# Patient Record
Sex: Male | Born: 1985 | Race: Black or African American | Hispanic: No | Marital: Single | Smoking: Current every day smoker
Health system: Southern US, Community
[De-identification: ages and names within clinical notes are randomized; demographics above are authoritative.]

## PROBLEM LIST (undated history)

## (undated) DIAGNOSIS — Z8709 Personal history of other diseases of the respiratory system: Secondary | ICD-10-CM

## (undated) DIAGNOSIS — Z794 Long term (current) use of insulin: Secondary | ICD-10-CM

## (undated) DIAGNOSIS — J45909 Unspecified asthma, uncomplicated: Secondary | ICD-10-CM

## (undated) DIAGNOSIS — I10 Essential (primary) hypertension: Secondary | ICD-10-CM

## (undated) DIAGNOSIS — N471 Phimosis: Secondary | ICD-10-CM

## (undated) DIAGNOSIS — E119 Type 2 diabetes mellitus without complications: Secondary | ICD-10-CM

## (undated) HISTORY — PX: NO PAST SURGERIES: SHX2092

## (undated) HISTORY — DX: Essential (primary) hypertension: I10

---

## 2013-10-25 DIAGNOSIS — Z560 Unemployment, unspecified: Secondary | ICD-10-CM | POA: Insufficient documentation

## 2016-08-04 DIAGNOSIS — B353 Tinea pedis: Secondary | ICD-10-CM | POA: Insufficient documentation

## 2016-08-04 DIAGNOSIS — F17200 Nicotine dependence, unspecified, uncomplicated: Secondary | ICD-10-CM | POA: Insufficient documentation

## 2016-08-11 ENCOUNTER — Emergency Department (HOSPITAL_COMMUNITY)
Admission: EM | Admit: 2016-08-11 | Discharge: 2016-08-11 | Disposition: A | Discharge status: Home / Self Care | Payer: Medicaid Other | Attending: Emergency Medicine | Admitting: Emergency Medicine

## 2016-08-11 ENCOUNTER — Encounter (HOSPITAL_COMMUNITY): Payer: Self-pay | Admitting: Emergency Medicine

## 2016-08-11 DIAGNOSIS — N476 Balanoposthitis: Secondary | ICD-10-CM | POA: Insufficient documentation

## 2016-08-11 DIAGNOSIS — F17210 Nicotine dependence, cigarettes, uncomplicated: Secondary | ICD-10-CM | POA: Diagnosis not present

## 2016-08-11 DIAGNOSIS — R1909 Other intra-abdominal and pelvic swelling, mass and lump: Secondary | ICD-10-CM | POA: Diagnosis present

## 2016-08-11 DIAGNOSIS — J45909 Unspecified asthma, uncomplicated: Secondary | ICD-10-CM | POA: Diagnosis not present

## 2016-08-11 DIAGNOSIS — E119 Type 2 diabetes mellitus without complications: Secondary | ICD-10-CM | POA: Insufficient documentation

## 2016-08-11 HISTORY — DX: Unspecified asthma, uncomplicated: J45.909

## 2016-08-11 HISTORY — DX: Type 2 diabetes mellitus without complications: E11.9

## 2016-08-11 LAB — URINALYSIS, ROUTINE W REFLEX MICROSCOPIC
BACTERIA UA: NONE SEEN
Bilirubin Urine: NEGATIVE
Glucose, UA: >=500 mg/dL — AB
Ketones, ur: 5 mg/dL — AB
Leukocytes, UA: NEGATIVE
Nitrite: NEGATIVE
Protein, ur: 30 mg/dL — AB
SPECIFIC GRAVITY, URINE: 1.033 — AB (ref 1.005–1.030)
pH: 5 (ref 5.0–8.0)

## 2016-08-11 LAB — RAPID HIV SCREEN (HIV 1/2 AB+AG)
HIV 1/2 Antibodies: NONREACTIVE
HIV-1 P24 ANTIGEN - HIV24: NONREACTIVE

## 2016-08-11 MED ORDER — CEFTRIAXONE SODIUM 250 MG IJ SOLR
250.0000 mg | Freq: Once | INTRAMUSCULAR | Status: AC
  Administered 2016-08-11: 250 mg via INTRAMUSCULAR
  Filled 2016-08-11: qty 250

## 2016-08-11 MED ORDER — CLOTRIMAZOLE 1 % EX CREA: TOPICAL_CREAM | CUTANEOUS | 0 refills | Status: DC

## 2016-08-11 MED ORDER — CEPHALEXIN 500 MG PO CAPS: 500.0000 mg | ORAL_CAPSULE | Freq: Four times a day (QID) | ORAL | 0 refills | Status: DC

## 2016-08-11 MED ORDER — AZITHROMYCIN 250 MG PO TABS
1000.0000 mg | ORAL_TABLET | Freq: Once | ORAL | Status: AC
  Administered 2016-08-11: 1000 mg via ORAL
  Filled 2016-08-11: qty 4

## 2016-08-11 MED ORDER — LIDOCAINE HCL (PF) 1 % IJ SOLN
INTRAMUSCULAR | Status: AC
  Administered 2016-08-11: 5 mL
  Filled 2016-08-11: qty 5

## 2016-08-11 NOTE — Discharge Instructions (Signed)
Clean the tip of the penis with soap and water.  Apply the cream as directed.  Take antibiotics as directed.  Return to the ER if you have redness of you scrotum, or redness/pain that spreads between you legs or back toward your buttock.

## 2016-08-11 NOTE — ED Triage Notes (Signed)
Pt reports groin swelling and penile pain for the last month. Reports lesions around penis. Denies recent fever. Denies dysuria.

## 2016-08-11 NOTE — ED Provider Notes (Signed)
[facility]-EMERGENCY DEPT Provider Note   CSN: [id number] Arrival date & time: 08/11/16  1942   By signing my name below, I, Talbert Nan, attest that this documentation has been prepared under the direction and in the presence of Roxy Horseman, PA-C. Electronically Signed: Talbert Nan, Scribe. 08/11/16. 9:24 PM.    History   Chief Complaint Chief Complaint  Patient presents with  . Groin Swelling    HPI Albert [name] is a 31 y.o. male with h/o Dm, asthma who presents to the Emergency Department complaining of gradual onset, moderate, 6/10 severity penile pain that began 1 month ago. Pain is located on the tip of his penis.  He reports associated lesions (warts).  Denies any redness or or swelling of his scrotum.  Pt has no h/o HIV.     The history is provided by the patient. No language interpreter was used.    Past Medical History:  Diagnosis Date  . Asthma   . Diabetes mellitus without complication (HCC)     There are no active problems to display for this patient.   No past surgical history on file.     Home Medications    Prior to Admission medications   Not on File    Family History No family history on file.  Social History Social History  Substance Use Topics  . Smoking status: Current Every Day Smoker    Packs/day: 1.00    Types: Cigarettes  . Smokeless tobacco: Not on file  . Alcohol use Yes     Comment: every few days     Allergies   Patient has no known allergies.   Review of Systems Review of Systems  Genitourinary: Positive for genital sores, penile pain and penile swelling.       Chaffing  Skin:       Swelling.     Physical Exam Updated Vital Signs BP 127/79 (BP Location: Right Arm)   Pulse 108   Temp 98.2 F (36.8 C) (Oral)   Resp 20   Ht 5\' 6"  (1.676 m)   Wt 205 lb (93 kg)   SpO2 98%   BMI 33.09 kg/m   Physical Exam  Constitutional: He is oriented to person, place, and time. He appears well-developed and well-nourished.   HENT:  Head: Normocephalic and atraumatic.  Eyes: Conjunctivae and EOM are normal.  Neck: Normal range of motion.  Cardiovascular: Normal rate.   Pulmonary/Chest: Effort normal.  Abdominal: He exhibits no distension.  Genitourinary:  Genitourinary Comments: Moderate swelling of the glands and surrounding skin, there is some exudate, there is no evidence of scrotal involvement, no evidence of Fournier's gangrene, appears consistent with balanoposthitis.  Circumcised  Musculoskeletal: Normal range of motion.  Neurological: He is alert and oriented to person, place, and time.  Skin: Skin is dry.  Psychiatric: He has a normal mood and affect. His behavior is normal. Judgment and thought content normal.  Nursing note and vitals reviewed.    ED Treatments / Results   DIAGNOSTIC STUDIES: Oxygen Saturation is 98% on room air, normal by my interpretation.    COORDINATION OF CARE: 9:22 PM Discussed treatment plan with pt at bedside and pt agreed to plan, which includes Ua, blood work.    Labs (all labs ordered are listed, but only abnormal results are displayed) Labs Reviewed - No data to display  EKG  EKG Interpretation None       Radiology No results found.  Procedures Procedures (including critical care time)  Medications  Ordered in ED Medications - No data to display   Initial Impression / Assessment and Plan / ED Course  I have reviewed the triage vital signs and the nursing notes.  Pertinent labs & imaging results that were available during my care of the patient were reviewed by me and considered in my medical decision making (see chart for details).     Patient with swelling and discharge to the tip of his penis 1 month. Symptoms are consistent with balanoposthitis, discussed patient with Dr. Clarene DukeLittle, who agrees with treatment plan for clotrimazole cream, as I am suspicious of streptococcal infection, I will cover with Keflex as well. Gonorrhea and chlamydia  testing is pending. Patient is circumcised. He is able to urinate. He is afebrile. I do not see any involvement of the tissue on his scrotum or between his legs, I did not see any evidence of foreign use gangrene. Plan for follow-up with primary care provider. Additionally he has some penile warts, and he asks about having these removed, I will refer him to urology.  Final Clinical Impressions(s) / ED Diagnoses   Final diagnoses:  Balanoposthitis    New Prescriptions New Prescriptions   CEPHALEXIN (KEFLEX) 500 MG CAPSULE    Take 1 capsule (500 mg total) by mouth 4 (four) times daily.   CLOTRIMAZOLE (LOTRIMIN) 1 % CREAM    Apply to affected area 2 times daily   I personally performed the services described in this documentation, which was scribed in my presence. The recorded information has been reviewed and is accurate.      Roxy Horsemanobert Dre Gamino, PA-C 08/11/16 2215    Roxy Horsemanobert Zainah Steven, PA-C 08/11/16 2215    Laurence Spatesachel Morgan Little, MD 08/12/16 [id number]

## 2016-08-11 NOTE — ED Notes (Signed)
Phlebotomy at bedside at this time.

## 2016-08-12 LAB — RPR: RPR: NONREACTIVE

## 2016-08-14 ENCOUNTER — Encounter: Payer: Self-pay | Admitting: Podiatry

## 2016-08-14 ENCOUNTER — Ambulatory Visit (INDEPENDENT_AMBULATORY_CARE_PROVIDER_SITE_OTHER): Payer: Medicaid Other | Admitting: Podiatry

## 2016-08-14 VITALS — BP 129/93 | HR 84

## 2016-08-14 DIAGNOSIS — E119 Type 2 diabetes mellitus without complications: Secondary | ICD-10-CM

## 2016-08-14 DIAGNOSIS — B351 Tinea unguium: Secondary | ICD-10-CM

## 2016-08-14 DIAGNOSIS — M2040 Other hammer toe(s) (acquired), unspecified foot: Secondary | ICD-10-CM | POA: Diagnosis not present

## 2016-08-14 DIAGNOSIS — M79676 Pain in unspecified toe(s): Secondary | ICD-10-CM

## 2016-08-14 DIAGNOSIS — M2010 Hallux valgus (acquired), unspecified foot: Secondary | ICD-10-CM

## 2016-08-14 NOTE — Progress Notes (Signed)
   Subjective:    Patient ID: Albert MainlandLashawn Dingledine, male    DOB: 1985-10-23, 31 y.o.   MRN: [id number]  HPI this patient presents the office for painful long thick nails. She was told by his medical doctor to make an appointment for a foot exam. Patient is a type I diabetic says his nails have grown thick and long nails are painful walking and wearing his shoes. Patient also has developed painful calluses on both big toes states he has provided no self treatment nor sought any professional help for his feet. He presents the office today for an evaluation and treatment of his diabetic feet.    Review of Systems  All other systems reviewed and are negative.      Objective:   Physical Exam GENERAL APPEARANCE: Alert, conversant. Appropriately groomed. No acute distress.  VASCULAR: Pedal pulses are  palpable at  [facility] and [facility] bilateral.  Capillary refill time is immediate to all digits,  Normal temperature gradient.  Digital hair growth is present bilateral  NEUROLOGIC: sensation is normal to 5.07 monofilament at 5/5 sites bilateral.  Light touch is intact bilateral, Muscle strength normal.  MUSCULOSKELETAL: acceptable muscle strength, tone and stability bilateral.  Intrinsic muscluature intact bilateral.  HAV  B/L with hammer toes 2-4  B/L.   DERMATOLOGIC: skin color, texture, and turgor are within normal limits.  No preulcerative lesions or ulcers  are seen, no interdigital maceration noted.  No open lesions present.  . No drainage noted. Pinch callus noted both feet.  NAILS  Thick disfigured discolored nails both feet.         Assessment & Plan:  Onychomycosis  B/L  Diabetes with no complications   IE  Debride nails  Diabetic foot exam performed.  RTC 1 year for foot exam.   Helane GuntherGregory Mayer DPM

## 2016-08-18 DIAGNOSIS — E1121 Type 2 diabetes mellitus with diabetic nephropathy: Secondary | ICD-10-CM | POA: Insufficient documentation

## 2016-08-25 DIAGNOSIS — E785 Hyperlipidemia, unspecified: Secondary | ICD-10-CM | POA: Insufficient documentation

## 2016-08-28 ENCOUNTER — Encounter: Payer: Medicaid Other | Attending: Internal Medicine | Admitting: *Deleted

## 2016-08-28 DIAGNOSIS — E109 Type 1 diabetes mellitus without complications: Secondary | ICD-10-CM | POA: Insufficient documentation

## 2016-08-28 DIAGNOSIS — Z713 Dietary counseling and surveillance: Secondary | ICD-10-CM | POA: Insufficient documentation

## 2016-08-28 DIAGNOSIS — E1065 Type 1 diabetes mellitus with hyperglycemia: Secondary | ICD-10-CM

## 2016-08-28 DIAGNOSIS — E108 Type 1 diabetes mellitus with unspecified complications: Secondary | ICD-10-CM

## 2016-08-28 DIAGNOSIS — F209 Schizophrenia, unspecified: Secondary | ICD-10-CM | POA: Diagnosis not present

## 2016-08-28 DIAGNOSIS — E669 Obesity, unspecified: Secondary | ICD-10-CM | POA: Diagnosis not present

## 2016-08-28 DIAGNOSIS — Z6829 Body mass index (BMI) 29.0-29.9, adult: Secondary | ICD-10-CM | POA: Diagnosis not present

## 2016-08-28 DIAGNOSIS — IMO0002 Reserved for concepts with insufficient information to code with codable children: Secondary | ICD-10-CM

## 2016-08-28 NOTE — Patient Instructions (Signed)
Plan:  Aim for 3 Carb Choices per meal (45 grams) +/- 1 either way  Aim for 0-2 Carbs per snack if hungry  Include protein in moderation with your meals and snacks Continue with your activity level by walking daily as tolerated Consider checking BG at alternate times per day   Consider taking medication 70/30 insulin before breakfast and before supper so it matches your food intake during the day and improves your blood sugar during the night.

## 2016-09-04 NOTE — Progress Notes (Signed)
Diabetes Self-Management Education  Visit Type: First/Initial  Appt. Start Time: 0800 Appt. End Time: 0930  09/04/2016  Mr. Albert MainlandLashawn [name], identified by name and date of birth, is a 31 y.o. male with a diagnosis of Diabetes:  . He is here with his father who sat quietly through the appointment with occasional nodding to questions asked of patient. Patient expressed fair understanding of what Diabetes is and interest in learning how to manage it better. He stated he could not afford Lantus insulin so he is now taking Novolin 70/30 but not always twice a day.He did not express understanding of the action time of this insulin. He states he walks often throughout the day so his activity level appears appropriate.   ASSESSMENT  Height 5\' 8"  (1.727 m), weight 190 lb 12.8 oz (86.5 kg). Body mass index is 29.01 kg/m.      Diabetes Self-Management Education - 08/28/16 0817      Visit Information   Visit Type First/Initial     Complications   Last HgB A1C per patient/outside source 12.6 %   How often do you check your blood sugar? 1-2 times/day   Fasting Blood glucose range (mg/dL) >161>200   Postprandial Blood glucose range (mg/dL) >096>200   Number of hypoglycemic episodes per month 0     Dietary Intake   Breakfast skips 2- 4 days a week OR eggs, bacon, toast and grits   Snack (morning) not lately   Lunch sandwich OR salad with boiled meat   Snack (afternoon) not lately   Dinner baked meat, vegetables, and starch, occasionally bread   Snack (evening) occasionally some baked chips   Beverage(s) water, diet soda     Exercise   Exercise Type Light (walking / raking leaves)   How many days per week to you exercise? 7   How many minutes per day do you exercise? 20   Total minutes per week of exercise 140     Patient Education   Previous Diabetes Education Yes (please comment)      Individualized Plan for Diabetes Self-Management Training:   Learning Objective:  Patient will have a  greater understanding of diabetes self-management. Patient education plan is to attend individual and/or group sessions per assessed needs and concerns.   Plan:   Patient Instructions  Plan:  Aim for 3 Carb Choices per meal (45 grams) +/- 1 either way  Aim for 0-2 Carbs per snack if hungry  Include protein in moderation with your meals and snacks Continue with your activity level by walking daily as tolerated Consider checking BG at alternate times per day   Consider taking medication 70/30 insulin before breakfast and before supper so it matches your food intake during the day and improves your blood sugar during the night.  Expected Outcomes:     Education material provided: Insulin action handout with BG Target ranges and other drawings on back.  If problems or questions, patient to contact team via:  Phone and Email  Future DSME appointment:   1 month

## 2016-10-01 ENCOUNTER — Ambulatory Visit: Payer: Medicaid Other | Admitting: *Deleted

## 2016-10-02 ENCOUNTER — Encounter: Payer: Medicaid Other | Attending: Internal Medicine | Admitting: *Deleted

## 2016-10-02 DIAGNOSIS — E109 Type 1 diabetes mellitus without complications: Secondary | ICD-10-CM | POA: Insufficient documentation

## 2016-10-02 DIAGNOSIS — F209 Schizophrenia, unspecified: Secondary | ICD-10-CM | POA: Diagnosis not present

## 2016-10-02 DIAGNOSIS — E669 Obesity, unspecified: Secondary | ICD-10-CM | POA: Insufficient documentation

## 2016-10-02 DIAGNOSIS — Z713 Dietary counseling and surveillance: Secondary | ICD-10-CM | POA: Diagnosis not present

## 2016-10-02 DIAGNOSIS — Z6829 Body mass index (BMI) 29.0-29.9, adult: Secondary | ICD-10-CM | POA: Diagnosis not present

## 2016-10-02 DIAGNOSIS — E108 Type 1 diabetes mellitus with unspecified complications: Secondary | ICD-10-CM

## 2016-10-02 NOTE — Patient Instructions (Signed)
Consider taking your insulin every day If your blood sugar is too high,(over 300)  make sure you have taken your insulin, drink lots of water and maybe go for a walk or get some exercise Try to eat some bread or other starchy food throughout the day

## 2016-10-06 NOTE — Progress Notes (Signed)
Diabetes Self-Management Education  Visit Type:  Follow-up  Appt. Start Time: 1100 Appt. End Time: 1130  10/06/2016  Albert [name], identified by name and date of birth, is a 31 y.o. male with a diagnosis of Diabetes: Type 1.  His father did not come into my office for the visit this time, he waited in the waiting room. Patient states he has some food insecurity with running out of food and living in a boarding house without refrigeration being available. Weight gain of 4 pounds noted from last visit on 08/28/2016. He states he is taking both types of insulin, Lantus and Novolin 70/30 but not at consistent times daily.   ASSESSMENT  Height  (1.727 m), weight 195 lb (88.5 kg). Body mass index is 29.65 kg/m.       Diabetes Self-Management Education - 10/06/16 1359      Psychosocial Assessment   Self-management support Family  although his father did not come into appointment with son this time.   Special Needs Simplified materials   Preferred Learning Style Auditory;Visual;Hands on     Complications   How often do you check your blood sugar? 3-4 times / week     Dietary Intake   Breakfast --  Significant for food insecurity     Exercise   Exercise Type Light (walking / raking leaves)   How many days per week to you exercise? 7   How many minutes per day do you exercise? 20   Total minutes per week of exercise 140     Patient Education   Previous Diabetes Education Yes (please comment)   Nutrition management  Carbohydrate counting;Meal options for control of blood glucose level and chronic complications.;Other (comment)  consider eating half of meal now and saving other half for next meal   Medications Reviewed patients medication for diabetes, action, purpose, timing of dose and side effects.   Psychosocial adjustment Helped patient identify a support system for diabetes management     Individualized Goals (developed by patient)   Nutrition General guidelines for  healthy choices and portions discussed   Physical Activity Exercise 5-7 days per week   Medications take my medication as prescribed     Post-Education Assessment   Patient understands incorporating nutritional management into lifestyle. Needs Review   Patient undertands incorporating physical activity into lifestyle. Demonstrates understanding / competency   Patient understands using medications safely. Needs Review   Patient understands monitoring blood glucose, interpreting and using results Needs Review   Patient understands prevention, detection, and treatment of acute complications. Demonstrates understanding / competency     Outcomes   Program Status Not Completed      Learning Objective:  Patient will have a greater understanding of diabetes self-management. Patient education plan is to attend individual and/or group sessions per assessed needs and concerns.  Plan:   Patient Instructions  Consider taking your insulin every day If your blood sugar is too high,(over 300)  make sure you have taken your insulin, drink lots of water and maybe go for a walk or get some exercise Try to eat some bread or other starchy food throughout the day  Expected Outcomes:  Demonstrated some interest in learning.  Expect minimal changes  Education material provided: Meal plan card  If problems or questions, patient to contact team via:  Phone  Future DSME appointment: - Patient states he feels these visits are helpful and requests a follow up in 4-6 wks

## 2016-11-06 ENCOUNTER — Encounter: Payer: Medicaid Other | Attending: Internal Medicine | Admitting: *Deleted

## 2016-11-06 DIAGNOSIS — F209 Schizophrenia, unspecified: Secondary | ICD-10-CM | POA: Insufficient documentation

## 2016-11-06 DIAGNOSIS — Z6829 Body mass index (BMI) 29.0-29.9, adult: Secondary | ICD-10-CM | POA: Insufficient documentation

## 2016-11-06 DIAGNOSIS — E669 Obesity, unspecified: Secondary | ICD-10-CM | POA: Insufficient documentation

## 2016-11-06 DIAGNOSIS — Z713 Dietary counseling and surveillance: Secondary | ICD-10-CM | POA: Insufficient documentation

## 2016-11-06 DIAGNOSIS — E109 Type 1 diabetes mellitus without complications: Secondary | ICD-10-CM | POA: Diagnosis present

## 2016-11-06 DIAGNOSIS — IMO0002 Reserved for concepts with insufficient information to code with codable children: Secondary | ICD-10-CM

## 2016-11-06 DIAGNOSIS — E1065 Type 1 diabetes mellitus with hyperglycemia: Secondary | ICD-10-CM

## 2016-11-06 DIAGNOSIS — E108 Type 1 diabetes mellitus with unspecified complications: Secondary | ICD-10-CM

## 2016-11-06 NOTE — Progress Notes (Signed)
Diabetes Self-Management Education  Visit Type:  Follow-up  Appt. Start Time: 1045 Appt. End Time: 1115  11/06/2016  [name], identified by name and date of birth, is a 31 y.o. male with a diagnosis of Diabetes: Type 1.  His father came into my office for this appointment at my request. Patient complaining of diarrhea, perhaps from Metformin. He states he is taking his insulin more consistently now, and states his BG have decreased to 150-190 mg/dl range now. He states no hypoglycemia issues. Food is very limited, he states his food stamps allow for $39/month. He is currently living with his father until he can get affordable housing, which they are applying for.   ASSESSMENT  Height 5\' 8"  (1.727 m), weight 195 lb 14.4 oz (88.9 kg). Body mass index is 29.79 kg/m.       Diabetes Self-Management Education - 11/06/16 1100      Psychosocial Assessment   Special Needs Simplified materials     Complications   Fasting Blood glucose range (mg/dL) 161-096180-200   Number of hypoglycemic episodes per month 0     Dietary Intake   Breakfast --  ramen noodles   Lunch ramen noodles or burger   Dinner ramen noodles   Beverage(s) diet soda, water     Exercise   Exercise Type Light (walking / raking leaves)   How many days per week to you exercise? 7     Post-Education Assessment   Patient undertands incorporating physical activity into lifestyle. Demonstrates understanding / competency   Patient understands using medications safely. Demonstrates understanding / competency  verbalizes better understanding of his insulin action     Outcomes   Program Status Not Completed     Learning Objective:  Patient will have a greater understanding of diabetes self-management. Patient education plan is to attend individual and/or group sessions per assessed needs and concerns.  Plan:   Patient Instructions  Continue taking your insulin every day If your blood sugar is too high,(over 300)   make sure you have taken your insulin, drink lots of water and maybe go for a walk or get some exercise Try to eat some bread or other starchy food throughout the day  Expected Outcomes:  Demonstrated interest in learning. Expect positive outcomes  Education material provided: My Plate, Insulin Action handout  If problems or questions, patient to contact team via:  Phone  Future DSME appointment: - 4-6 wks

## 2016-11-06 NOTE — Patient Instructions (Signed)
Continue taking your insulin every day If your blood sugar is too high,(over 300)  make sure you have taken your insulin, drink lots of water and maybe go for a walk or get some exercise Try to eat some bread or other starchy food throughout the day

## 2016-12-09 ENCOUNTER — Encounter: Payer: Medicaid Other | Attending: Internal Medicine | Admitting: *Deleted

## 2016-12-09 DIAGNOSIS — E669 Obesity, unspecified: Secondary | ICD-10-CM | POA: Diagnosis not present

## 2016-12-09 DIAGNOSIS — Z713 Dietary counseling and surveillance: Secondary | ICD-10-CM | POA: Insufficient documentation

## 2016-12-09 DIAGNOSIS — Z6829 Body mass index (BMI) 29.0-29.9, adult: Secondary | ICD-10-CM | POA: Insufficient documentation

## 2016-12-09 DIAGNOSIS — F209 Schizophrenia, unspecified: Secondary | ICD-10-CM | POA: Diagnosis not present

## 2016-12-09 DIAGNOSIS — E109 Type 1 diabetes mellitus without complications: Secondary | ICD-10-CM | POA: Insufficient documentation

## 2016-12-09 NOTE — Progress Notes (Signed)
Diabetes Self-Management Education  Visit Type:     Appt. Start Time: 1400 Appt. End Time: 1430  12/09/2016  [name], identified by name and date of birth, is a 31 y.o. male with a diagnosis of Diabetes:  .  His father came into my office for this appointment at my request. Weight stable at 196 pounds. States still living with his father, although patient is not answering questions appropriately, discussing being on a journey, needing to get things back the way they were in the past, and mumbling. His father asked to speak with me privately and states his son is not taking his bipolar medications, is violent at times and he needs to speak with his MD for medical attention.  ASSESSMENT  Height 5\' 8"  (1.727 m), weight 196 lb (88.9 kg). Body mass index is 29.8 kg/m.   Patient repeatedly stated that he is taking his insulin every day. Most of his comments were rambling in nature but not aggressive. I reminded him to include some starch throughout the day.  Learning Objective:  Patient will have a greater understanding of diabetes self-management. Patient education plan is to attend individual and/or group sessions per assessed needs and concerns.  Plan:   Patient Instructions  Continue taking your insulin every day If your blood sugar is too high,(over 300)  make sure you have taken your insulin, drink lots of water and maybe go for a walk or get some exercise Try to eat some bread or other starchy food throughout the day  Father to reach out to patient's primary care MD for any medical information he might need regarding his son.   Expected Outcomes:     Education material provided: no new handouts today.  If problems or questions, patient to contact team via:  Phone  Future DSME appointment: -  PRN

## 2017-01-09 ENCOUNTER — Ambulatory Visit: Payer: Medicaid Other | Admitting: *Deleted

## 2017-01-13 ENCOUNTER — Encounter: Payer: Medicaid Other | Attending: Internal Medicine | Admitting: *Deleted

## 2017-01-13 DIAGNOSIS — E109 Type 1 diabetes mellitus without complications: Secondary | ICD-10-CM | POA: Diagnosis not present

## 2017-01-13 DIAGNOSIS — E669 Obesity, unspecified: Secondary | ICD-10-CM | POA: Insufficient documentation

## 2017-01-13 DIAGNOSIS — F209 Schizophrenia, unspecified: Secondary | ICD-10-CM | POA: Diagnosis not present

## 2017-01-13 DIAGNOSIS — Z713 Dietary counseling and surveillance: Secondary | ICD-10-CM | POA: Diagnosis not present

## 2017-01-13 DIAGNOSIS — E108 Type 1 diabetes mellitus with unspecified complications: Secondary | ICD-10-CM

## 2017-01-13 DIAGNOSIS — Z6829 Body mass index (BMI) 29.0-29.9, adult: Secondary | ICD-10-CM | POA: Diagnosis not present

## 2017-01-13 NOTE — Progress Notes (Signed)
Diabetes Self-Management Education  Visit Type:     Appt. Start Time: 1445 Appt. End Time: 1510  01/13/2017  [name], identified by name and date of birth, is a 31 y.o. male with a diagnosis of Type 1 Diabetes.  His father brought him today but did not come into my office for this appointment. Weight stable at 196 pounds. States still living with his father, patient answered questions more appropriately today than last visit. He states he is eating 2-3 meals a day, checks his BG every other day with BG range of 180 - 300 mg/dl. He states when asked if BG is above 300 what should he do, he answered to go for a walk. His father states  that he has not been able to speak with his son's MD yet.  ASSESSMENT  Height 5\' 8"  (1.727 m), weight 196 lb (88.9 kg). Body mass index is 29.8 kg/m.   Per diet history stated today, patient recalls mix of starch and protein at most meals. He states he is more likely to take his insulin on his "good" days when he is able to walk and get exercise during the day too.   Learning Objective:  Patient will have a greater understanding of diabetes self-management. Patient education plan is to attend individual and/or group sessions per assessed needs and concerns.  Plan:   Patient Instructions  Take your insulin every day. Your body cannot make it so it is your job to take it every day or the food won't get where it needs to go and you will get sick. If your blood sugar is too high,(over 300)  make sure you have taken your insulin, drink lots of water and then maybe go for a walk or get some exercise Try to eat some bread or other starchy food throughout the day  Expected Outcomes:     Education material provided: no new handouts today.  If problems or questions, patient to contact team via:  Phone  Future DSME appointment: -   3 months

## 2017-04-14 ENCOUNTER — Ambulatory Visit: Payer: Medicaid Other | Admitting: *Deleted

## 2017-04-17 ENCOUNTER — Encounter (HOSPITAL_COMMUNITY): Payer: Self-pay

## 2017-04-17 ENCOUNTER — Emergency Department (HOSPITAL_COMMUNITY): Payer: No Typology Code available for payment source

## 2017-04-17 ENCOUNTER — Emergency Department (HOSPITAL_COMMUNITY)
Admission: EM | Admit: 2017-04-17 | Discharge: 2017-04-17 | Disposition: A | Discharge status: Home / Self Care | Payer: No Typology Code available for payment source | Attending: Emergency Medicine | Admitting: Emergency Medicine

## 2017-04-17 DIAGNOSIS — Y9389 Activity, other specified: Secondary | ICD-10-CM | POA: Diagnosis not present

## 2017-04-17 DIAGNOSIS — S81811A Laceration without foreign body, right lower leg, initial encounter: Secondary | ICD-10-CM | POA: Diagnosis not present

## 2017-04-17 DIAGNOSIS — Y998 Other external cause status: Secondary | ICD-10-CM | POA: Diagnosis not present

## 2017-04-17 DIAGNOSIS — F17210 Nicotine dependence, cigarettes, uncomplicated: Secondary | ICD-10-CM | POA: Insufficient documentation

## 2017-04-17 DIAGNOSIS — Y929 Unspecified place or not applicable: Secondary | ICD-10-CM | POA: Insufficient documentation

## 2017-04-17 DIAGNOSIS — E119 Type 2 diabetes mellitus without complications: Secondary | ICD-10-CM | POA: Insufficient documentation

## 2017-04-17 DIAGNOSIS — Z79899 Other long term (current) drug therapy: Secondary | ICD-10-CM | POA: Insufficient documentation

## 2017-04-17 DIAGNOSIS — Z23 Encounter for immunization: Secondary | ICD-10-CM | POA: Insufficient documentation

## 2017-04-17 DIAGNOSIS — F17228 Nicotine dependence, chewing tobacco, with other nicotine-induced disorders: Secondary | ICD-10-CM | POA: Diagnosis not present

## 2017-04-17 DIAGNOSIS — J45909 Unspecified asthma, uncomplicated: Secondary | ICD-10-CM | POA: Insufficient documentation

## 2017-04-17 DIAGNOSIS — Z794 Long term (current) use of insulin: Secondary | ICD-10-CM | POA: Insufficient documentation

## 2017-04-17 DIAGNOSIS — S8991XA Unspecified injury of right lower leg, initial encounter: Secondary | ICD-10-CM | POA: Diagnosis present

## 2017-04-17 LAB — BASIC METABOLIC PANEL
ANION GAP: 13 (ref 5–15)
BUN: 14 mg/dL (ref 6–20)
CHLORIDE: 98 mmol/L — AB (ref 101–111)
CO2: 22 mmol/L (ref 22–32)
Calcium: 9.5 mg/dL (ref 8.9–10.3)
Creatinine, Ser: 0.85 mg/dL (ref 0.61–1.24)
GFR calc Af Amer: >60 mL/min (ref >60)
GFR calc non Af Amer: >60 mL/min (ref >60)
GLUCOSE: 307 mg/dL — AB (ref 65–99)
POTASSIUM: 4.5 mmol/L (ref 3.5–5.1)
Sodium: 133 mmol/L — ABNORMAL LOW (ref 135–145)

## 2017-04-17 LAB — CBC
HEMATOCRIT: 44.5 % (ref 39.0–52.0)
HEMOGLOBIN: 15.8 g/dL (ref 13.0–17.0)
MCH: 31.7 pg (ref 26.0–34.0)
MCHC: 35.5 g/dL (ref 30.0–36.0)
MCV: 89.2 fL (ref 78.0–100.0)
Platelets: 200 10*3/uL (ref 150–400)
RBC: 4.99 MIL/uL (ref 4.22–5.81)
RDW: 12.7 % (ref 11.5–15.5)
WBC: 6.6 10*3/uL (ref 4.0–10.5)

## 2017-04-17 LAB — ETHANOL: Alcohol, Ethyl (B): 227 mg/dL — ABNORMAL HIGH (ref <10)

## 2017-04-17 MED ORDER — TETANUS-DIPHTH-ACELL PERTUSSIS 5-2.5-18.5 LF-MCG/0.5 IM SUSP
0.5000 mL | Freq: Once | INTRAMUSCULAR | Status: AC
  Administered 2017-04-17: 0.5 mL via INTRAMUSCULAR
  Filled 2017-04-17: qty 0.5

## 2017-04-17 MED ORDER — LIDOCAINE-EPINEPHRINE (PF) 2 %-1:200000 IJ SOLN
20.0000 mL | Freq: Once | INTRAMUSCULAR | Status: AC
  Administered 2017-04-17: 20 mL
  Filled 2017-04-17: qty 20

## 2017-04-17 MED ORDER — SODIUM CHLORIDE 0.9 % IV BOLUS (SEPSIS)
1000.0000 mL | Freq: Once | INTRAVENOUS | Status: AC
  Administered 2017-04-17: 1000 mL via INTRAVENOUS

## 2017-04-17 MED ORDER — CEPHALEXIN 250 MG PO CAPS
500.0000 mg | ORAL_CAPSULE | Freq: Once | ORAL | Status: AC
  Administered 2017-04-17: 500 mg via ORAL
  Filled 2017-04-17: qty 2

## 2017-04-17 NOTE — ED Notes (Signed)
Taken to CT.
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
## 2017-04-17 NOTE — ED Notes (Signed)
MD at the bedside  

## 2017-04-17 NOTE — ED Notes (Signed)
Phlebotomy at the bedside obtaining labs 

## 2017-04-17 NOTE — ED Notes (Signed)
Pt to be taken to Xray

## 2017-04-17 NOTE — Discharge Instructions (Signed)
Return to the ER in 10 days for staple removal  Return to the ER sooner for new or worsening symptoms
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
## 2017-04-17 NOTE — ED Notes (Signed)
Pt returned from [facility].
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
## 2017-04-17 NOTE — ED Notes (Signed)
MD Campos at the bedside  

## 2017-04-30 ENCOUNTER — Encounter (HOSPITAL_COMMUNITY): Payer: Self-pay | Admitting: *Deleted

## 2017-04-30 ENCOUNTER — Observation Stay (HOSPITAL_COMMUNITY)
Admission: EM | Admit: 2017-04-30 | Discharge: 2017-04-30 | Discharge status: Against Medical Advice | Payer: No Typology Code available for payment source | Attending: Family Medicine | Admitting: Family Medicine

## 2017-04-30 DIAGNOSIS — F17210 Nicotine dependence, cigarettes, uncomplicated: Secondary | ICD-10-CM | POA: Insufficient documentation

## 2017-04-30 DIAGNOSIS — T8149XD Infection following a procedure, other surgical site, subsequent encounter: Principal | ICD-10-CM | POA: Insufficient documentation

## 2017-04-30 DIAGNOSIS — Z4802 Encounter for removal of sutures: Secondary | ICD-10-CM | POA: Diagnosis present

## 2017-04-30 DIAGNOSIS — Z9114 Patient's other noncompliance with medication regimen: Secondary | ICD-10-CM | POA: Diagnosis not present

## 2017-04-30 DIAGNOSIS — J45909 Unspecified asthma, uncomplicated: Secondary | ICD-10-CM | POA: Insufficient documentation

## 2017-04-30 DIAGNOSIS — E1165 Type 2 diabetes mellitus with hyperglycemia: Secondary | ICD-10-CM | POA: Diagnosis not present

## 2017-04-30 DIAGNOSIS — Z794 Long term (current) use of insulin: Secondary | ICD-10-CM | POA: Insufficient documentation

## 2017-04-30 DIAGNOSIS — Z9119 Patient's noncompliance with other medical treatment and regimen: Secondary | ICD-10-CM | POA: Insufficient documentation

## 2017-04-30 DIAGNOSIS — Z5321 Procedure and treatment not carried out due to patient leaving prior to being seen by health care provider: Secondary | ICD-10-CM | POA: Diagnosis not present

## 2017-04-30 DIAGNOSIS — L089 Local infection of the skin and subcutaneous tissue, unspecified: Secondary | ICD-10-CM | POA: Diagnosis present

## 2017-04-30 DIAGNOSIS — T148XXA Other injury of unspecified body region, initial encounter: Secondary | ICD-10-CM

## 2017-04-30 LAB — BASIC METABOLIC PANEL
Anion gap: 10 (ref 5–15)
BUN: <5 mg/dL — ABNORMAL LOW (ref 6–20)
CO2: 27 mmol/L (ref 22–32)
Calcium: 9.3 mg/dL (ref 8.9–10.3)
Chloride: 97 mmol/L — ABNORMAL LOW (ref 101–111)
Creatinine, Ser: 0.8 mg/dL (ref 0.61–1.24)
GFR calc Af Amer: >60 mL/min (ref >60)
GFR calc non Af Amer: >60 mL/min (ref >60)
Glucose, Bld: 353 mg/dL — ABNORMAL HIGH (ref 65–99)
Potassium: 4 mmol/L (ref 3.5–5.1)
Sodium: 134 mmol/L — ABNORMAL LOW (ref 135–145)

## 2017-04-30 LAB — CBC WITH DIFFERENTIAL/PLATELET
Basophils Absolute: 0 10*3/uL (ref 0.0–0.1)
Basophils Relative: 0 %
Eosinophils Absolute: 0 10*3/uL (ref 0.0–0.7)
Eosinophils Relative: 1 %
HCT: 36.3 % — ABNORMAL LOW (ref 39.0–52.0)
Hemoglobin: 12.7 g/dL — ABNORMAL LOW (ref 13.0–17.0)
Lymphocytes Relative: 27 %
Lymphs Abs: 1.5 10*3/uL (ref 0.7–4.0)
MCH: 31.3 pg (ref 26.0–34.0)
MCHC: 35 g/dL (ref 30.0–36.0)
MCV: 89.4 fL (ref 78.0–100.0)
Monocytes Absolute: 0.2 10*3/uL (ref 0.1–1.0)
Monocytes Relative: 4 %
Neutro Abs: 3.9 10*3/uL (ref 1.7–7.7)
Neutrophils Relative %: 68 %
Platelets: 410 10*3/uL — ABNORMAL HIGH (ref 150–400)
RBC: 4.06 MIL/uL — ABNORMAL LOW (ref 4.22–5.81)
RDW: 12.1 % (ref 11.5–15.5)
WBC: 5.7 10*3/uL (ref 4.0–10.5)

## 2017-04-30 MED ORDER — CEPHALEXIN 500 MG PO CAPS: 500.0000 mg | ORAL_CAPSULE | Freq: Four times a day (QID) | ORAL | 0 refills | Status: AC

## 2017-04-30 MED ORDER — SULFAMETHOXAZOLE-TRIMETHOPRIM 800-160 MG PO TABS
1.0000 | ORAL_TABLET | Freq: Once | ORAL | Status: AC
  Administered 2017-04-30: 1 via ORAL
  Filled 2017-04-30: qty 1

## 2017-04-30 MED ORDER — CEPHALEXIN 250 MG PO CAPS
500.0000 mg | ORAL_CAPSULE | Freq: Once | ORAL | Status: AC
  Administered 2017-04-30: 500 mg via ORAL
  Filled 2017-04-30: qty 2

## 2017-04-30 MED ORDER — SULFAMETHOXAZOLE-TRIMETHOPRIM 800-160 MG PO TABS: 1.0000 | ORAL_TABLET | Freq: Two times a day (BID) | ORAL | 0 refills | Status: AC

## 2017-04-30 NOTE — ED Triage Notes (Signed)
Pt here to suture removal to right lower leg, reports they were placed two weeks ago but pt has not changed dressing since.

## 2017-04-30 NOTE — ED Notes (Signed)
Pt has signed out AMA, stressed to pt to take his antibiotics and return here tomorrow for wound check. Pt agrees and understands instructions.

## 2017-04-30 NOTE — ED Notes (Signed)
Admitting at bedside 
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
## 2017-04-30 NOTE — ED Notes (Signed)
Went to start pt's iv, he states that he is not staying for admission. Dr Denton Lanksteinl coming to speak with pt.

## 2017-04-30 NOTE — Discharge Instructions (Signed)
Please take all of your antibiotics until finished!   You may develop abdominal discomfort or diarrhea from the antibiotic.  You may help offset this with probiotics which you can buy or get in yogurt. Do not eat  or take the probiotics until 2 hours after your antibiotic.   You will take 1 medication twice daily (every 12 hours) and the other antibiotic 4 times daily (every 6 hours).  It is very important that you take the antibiotics completely until they are finished.  Return to the emergency department in 24 hours (Friday) for wound recheck.  Return to the ED sooner if any worsening signs or symptoms develop.  Follow-up with your primary care physician as soon as possible for reevaluation of your diabetes and your wound

## 2017-05-07 NOTE — ED Provider Notes (Signed)
Noted that patient did not follow up for wound re-check as instructed. Attempted to call phone numbers we have on file. One number did not belong to the patient and the second went to voicemail without specific indication that it was the patient's voicemail. Unable to leave message.    Jeanie SewerFawze, Dannica Bickham A, PA-C 05/07/17 1016    Cathren LaineSteinl, Kevin, MD 05/08/17 1105
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
## 2018-09-16 DIAGNOSIS — I10 Essential (primary) hypertension: Secondary | ICD-10-CM | POA: Insufficient documentation

## 2019-03-26 ENCOUNTER — Encounter (HOSPITAL_COMMUNITY): Payer: Self-pay | Admitting: Emergency Medicine

## 2019-03-26 ENCOUNTER — Emergency Department (HOSPITAL_COMMUNITY)
Admission: EM | Admit: 2019-03-26 | Discharge: 2019-03-26 | Disposition: A | Discharge status: Home / Self Care | Payer: Medicaid Other | Attending: Emergency Medicine | Admitting: Emergency Medicine

## 2019-03-26 DIAGNOSIS — Q5569 Other congenital malformation of penis: Secondary | ICD-10-CM | POA: Insufficient documentation

## 2019-03-26 DIAGNOSIS — J45909 Unspecified asthma, uncomplicated: Secondary | ICD-10-CM | POA: Insufficient documentation

## 2019-03-26 DIAGNOSIS — F17210 Nicotine dependence, cigarettes, uncomplicated: Secondary | ICD-10-CM | POA: Diagnosis not present

## 2019-03-26 DIAGNOSIS — E119 Type 2 diabetes mellitus without complications: Secondary | ICD-10-CM | POA: Insufficient documentation

## 2019-03-26 DIAGNOSIS — Z794 Long term (current) use of insulin: Secondary | ICD-10-CM | POA: Diagnosis not present

## 2019-03-26 DIAGNOSIS — N509 Disorder of male genital organs, unspecified: Secondary | ICD-10-CM | POA: Diagnosis present

## 2019-03-26 MED ORDER — TRIAMCINOLONE ACETONIDE 0.1 % EX CREA: 1.0000 "application " | TOPICAL_CREAM | Freq: Two times a day (BID) | CUTANEOUS | 0 refills | Status: DC

## 2019-03-26 NOTE — ED Provider Notes (Signed)
[facility] EMERGENCY DEPARTMENT Provider Note   CSN: [id number] Arrival date & time: 03/26/19  1244     History   Chief Complaint No chief complaint on file.   HPI Albert [name] is a 33 y.o. male.     33yo male with complaint of abnormal penis. Patient reports the head of his penis has retracted into the shaft, has been this way for the past 6 months. Patient states he was previously circumcised, is able to maintain an erection although the head of the penis remains hidden. Patient denies dysuria, states he has to sit to void as his urine sprays everywhere. Denies urethral discharge, pain or swelling in the penis or testicles. No concerns for retained foreign bodies. No other complaints or concerns.      Past Medical History:  Diagnosis Date  . Asthma   . Diabetes mellitus without complication [facility])     Patient Active Problem List   Diagnosis Date Noted  . Wound infection 04/30/2017    No past surgical history on file.      Home Medications    Prior to Admission medications   Medication Sig Start Date End Date Taking? Authorizing Provider  insulin glargine (LANTUS) 100 UNIT/ML injection Inject 8-12 Units at bedtime into the skin.     [provider]  insulin NPH-regular Human (NOVOLIN 70/30) (70-30) 100 UNIT/ML injection Inject 8 Units into the skin 2 (two) times daily with a meal.    [provider]  metFORMIN (GLUCOPHAGE) 500 MG tablet Take 500 mg by mouth daily.    [provider]  triamcinolone cream (KENALOG) 0.1 % Apply 1 application topically 2 (two) times daily. 03/26/19   Jeannie Fend, PA-C    Family History No family history on file.  Social History Social History   Tobacco Use  . Smoking status: Current Every Day Smoker    Packs/day: 1.00    Types: Cigarettes  . Smokeless tobacco: Current User    Types: Chew  Substance Use Topics  . Alcohol use: Yes    Comment: every few days  . Drug use: No      Allergies   Patient has no known allergies.   Review of Systems Review of Systems  Constitutional: Negative for fever.  Gastrointestinal: Negative for abdominal pain.  Genitourinary: Negative for decreased urine volume, discharge, dysuria, frequency, penile pain, penile swelling, scrotal swelling, testicular pain and urgency.  Skin: Negative for wound.  Allergic/Immunologic: Positive for immunocompromised state.  All other systems reviewed and are negative.    Physical Exam Updated Vital Signs BP (!) 152/96 (BP Location: Right Arm)   Pulse (!) 104   Temp 99.2 F (37.3 C) (Oral)   Resp 17   SpO2 96%   Physical Exam Vitals signs and nursing note reviewed. Exam conducted with a chaperone present.  Constitutional:      General: He is not in acute distress.    Appearance: He is well-developed. He is not diaphoretic.  HENT:     Head: Normocephalic and atraumatic.  Pulmonary:     Effort: Pulmonary effort is normal.  Abdominal:     Palpations: Abdomen is soft.     Tenderness: There is no abdominal tenderness.  Genitourinary:    Comments: Glans of penis is not visible, penis appears to be uncircumcised, if this is the case, unable to retract the foreskin.  Question presence of genital warts. Skin:    General: Skin is warm and dry.  Findings: No erythema.  Neurological:     Mental Status: He is alert and oriented to person, place, and time.  Psychiatric:        Behavior: Behavior normal.      ED Treatments / Results  Labs (all labs ordered are listed, but only abnormal results are displayed) Labs Reviewed - No data to display  EKG None  Radiology No results found.  Procedures Procedures (including critical care time)  Medications Ordered in ED Medications - No data to display   Initial Impression / Assessment and Plan / ED Course  I have reviewed the triage vital signs and the nursing notes.  Pertinent labs & imaging results that were available  during my care of the patient were reviewed by me and considered in my medical decision making (see chart for details).  Clinical Course as of Mar 25 1538  Sat Oct 17, 477  [id number] 33 year old male presents with concern for his penis.  On exam, glans of penis is not visible, appears as if penis was not circumcised, unable to retract foreskin.  Patient states that he was previously circumcised.  Patient is able to urinate, no concerns for retention or urinary tract infection.  Discussed with Dr. Wilson Singer, ER attending, recommends apply triamcinolone to area and follow-up with urology.   [LM]    Clinical Course User Index [LM] Tacy Learn, PA-C      Final Clinical Impressions(s) / ED Diagnoses   Final diagnoses:  Penile anomaly    ED Discharge Orders         Ordered    triamcinolone cream (KENALOG) 0.1 %  2 times daily     03/26/19 1333           Roque Lias 03/26/19 1539    Virgel Manifold, MD 03/26/19 1845

## 2019-03-26 NOTE — Discharge Instructions (Addendum)
Apply triamcinolone cream as prescribed. Follow-up with urology, call the office on Monday to schedule an appointment. Return to the ER for fever or inability to urinate.

## 2019-03-26 NOTE — ED Triage Notes (Signed)
Pt here with c/o penis pain , states that he has a hard time urinating ,no discharge or burning , states that he think his penis is "closing up "

## 2019-04-20 ENCOUNTER — Other Ambulatory Visit: Payer: Self-pay | Admitting: Urology

## 2019-04-22 ENCOUNTER — Other Ambulatory Visit (HOSPITAL_COMMUNITY): Payer: Medicaid Other

## 2019-04-22 ENCOUNTER — Inpatient Hospital Stay (HOSPITAL_COMMUNITY): Admission: RE | Admit: 2019-04-22 | Payer: Medicaid Other | Source: Ambulatory Visit

## 2019-05-18 ENCOUNTER — Encounter (HOSPITAL_BASED_OUTPATIENT_CLINIC_OR_DEPARTMENT_OTHER): Payer: Self-pay | Admitting: *Deleted

## 2019-05-18 ENCOUNTER — Other Ambulatory Visit: Payer: Self-pay

## 2019-05-18 NOTE — Progress Notes (Signed)
Spoke w/ via phone for pre-op interview--- PT Lab needs dos----  Istat 8 and EKG             Lab results------  no COVID test ------  05-20-2019 @ 9292 Arrive at -------  0945 NPO after ------ [place] Medications to take morning of surgery ----- NONE Diabetic medication ----- do not take metformin and do not do any insulin morning of surgery Patient Special Instructions ----- n/a Pre-Op special Istructions -----  N/a  Patient verbalized understanding of instructions that were given at this phone interview. Patient denies shortness of breath, chest pain, fever, cough a this phone interview.   Anesthesia:  PCP:  Dr Latanya Presser Chest x-ray : 04-17-2017 epic EKG :  No Sleep Study/ CPAP :  NO Fasting Blood Sugar :  190    / Checks Blood Sugar -- times a day:  Daily in AM

## 2019-05-20 ENCOUNTER — Inpatient Hospital Stay (HOSPITAL_COMMUNITY): Admission: RE | Admit: 2019-05-20 | Payer: Medicaid Other | Source: Ambulatory Visit

## 2019-05-24 ENCOUNTER — Ambulatory Visit (HOSPITAL_BASED_OUTPATIENT_CLINIC_OR_DEPARTMENT_OTHER): Admission: RE | Admit: 2019-05-24 | Payer: Medicaid Other | Source: Home / Self Care | Admitting: Urology

## 2019-05-24 HISTORY — DX: Personal history of other diseases of the respiratory system: Z87.09

## 2019-05-24 HISTORY — DX: Type 2 diabetes mellitus without complications: E11.9

## 2019-05-24 HISTORY — DX: Phimosis: N47.1

## 2019-05-24 HISTORY — DX: Type 2 diabetes mellitus without complications: Z79.4

## 2019-05-24 SURGERY — CIRCUMCISION, ADULT
Anesthesia: General

## 2019-05-30 ENCOUNTER — Other Ambulatory Visit: Payer: Self-pay | Admitting: Urology

## 2019-06-15 ENCOUNTER — Other Ambulatory Visit: Payer: Self-pay

## 2019-06-15 ENCOUNTER — Encounter (HOSPITAL_BASED_OUTPATIENT_CLINIC_OR_DEPARTMENT_OTHER): Payer: Self-pay | Admitting: Urology

## 2019-06-15 ENCOUNTER — Other Ambulatory Visit: Payer: Self-pay | Admitting: Urology

## 2019-06-15 NOTE — Progress Notes (Signed)
Spoke w/ via phone for pre-op interview---Albert [name] needs dos----   I stat 8, ekg           [name] results------ COVID test ------06-17-2019 Arrive at -------1115 am 06-21-2019 NPO after ------midnight food clear liquids until 715 am then npo Medications to take morning of surgery -----none Diabetic medication -----none day of surgery Patient Special Instructions ----- Pre-Op special Istructions ----- Patient verbalized understanding of instructions that were given at this phone interview. Patient denies shortness of breath, chest pain, fever, cough a this phone interview.

## 2019-06-17 ENCOUNTER — Other Ambulatory Visit (HOSPITAL_COMMUNITY)
Admission: RE | Admit: 2019-06-17 | Discharge: 2019-06-17 | Disposition: A | Discharge status: Home / Self Care | Payer: Medicaid Other | Source: Ambulatory Visit | Attending: Urology | Admitting: Urology

## 2019-06-17 DIAGNOSIS — Z01812 Encounter for preprocedural laboratory examination: Secondary | ICD-10-CM | POA: Insufficient documentation

## 2019-06-17 DIAGNOSIS — Z20822 Contact with and (suspected) exposure to covid-19: Secondary | ICD-10-CM | POA: Insufficient documentation

## 2019-06-18 LAB — NOVEL CORONAVIRUS, NAA (HOSP ORDER, SEND-OUT TO REF LAB; TAT 18-24 HRS): SARS-CoV-2, NAA: NOT DETECTED

## 2019-06-20 NOTE — H&P (Signed)
CC/HPI: cc: phimosis   04/19/19: 34 year old man with phimosis treated with topical steroid and antifungal cream returns to clinic for follow-up. He has not had any improvement with the topical cream and still cannot retract foreskin over the head of his penis. He is frustrated by the situation and would like to proceed with a circumcision.   03/31/19: 34 year old man presents with inability to retract foreskin over the head of his penis. He states this is been having for about 4-6 months. He needs to sit down to void and his head is not exposed with erections. He denies any hematuria. This has never happened before. He states he was circumcised as a baby. He has not tried anything for the swelling of his foreskin. He is able to empty his bladder and his stream is good it is just that he needs to sit down because the foreskin is so tight. This has never happened to the patient before.     ALLERGIES: No Known Allergies    MEDICATIONS: Metformin Hcl     GU PSH: None   NON-GU PSH: None   GU PMH: Phimosis, Will treat patient with topical steroid and antifungal cream. Did discuss the possibility of needing a circumcision if the creams do not improve the phimosis. - 03/31/2019    NON-GU PMH: Condyloma latum, Discussed surgical ablation of patient's condyloma at the same time as circumcision. will address at follow up visit. - 03/31/2019 Asthma Diabetes Type 2    FAMILY HISTORY: Hypertension - Grandmother Kidney Failure - Father   SOCIAL HISTORY: Marital Status: Single Preferred Language: English; Ethnicity: Not Hispanic Or Latino; Race: Black or African American Current Smoking Status: Patient smokes.   Tobacco Use Assessment Completed: Used Tobacco in last 30 days? Social Drinker.  Does not drink caffeine.    REVIEW OF SYSTEMS:    GU Review Male:   Patient denies frequent urination, hard to postpone urination, burning/ pain with urination, get up at night to urinate, leakage of  urine, stream starts and stops, trouble starting your stream, have to strain to urinate , erection problems, and penile pain.  Gastrointestinal (Upper):   Patient denies nausea, vomiting, and indigestion/ heartburn.  Gastrointestinal (Lower):   Patient denies diarrhea and constipation.  Constitutional:   Patient denies fever, night sweats, weight loss, and fatigue.  Skin:   Patient denies skin rash/ lesion and itching.  Eyes:   Patient denies blurred vision and double vision.  Ears/ Nose/ Throat:   Patient denies sore throat and sinus problems.  Hematologic/Lymphatic:   Patient denies swollen glands and easy bruising.  Cardiovascular:   Patient denies leg swelling and chest pains.  Respiratory:   Patient denies cough and shortness of breath.  Endocrine:   Patient denies excessive thirst.  Musculoskeletal:   Patient denies back pain and joint pain.  Neurological:   Patient denies headaches and dizziness.  Psychologic:   Patient denies depression and anxiety.   VITAL SIGNS:      04/19/2019 09:53 AM  Weight 230 lb / 104.33 kg  Height 69 in / 175.26 cm  BP 141/94 mmHg  Pulse 93 /min  Temperature 97.3 F / 36.2 C  BMI 34.0 kg/m   GU PHYSICAL EXAMINATION:    Urethral Meatus: Unable to see the urethral meatus secondary to phimosis.  Penis: Penis uncircumcised, phimosis, > 5 penile warts, cracks and fissures. No dorsal peyronie's plaques, no left corporal peyronie's plaques, no right corporal peyronie's plaques, no scarring, no shaft warts. No balanitis, no  meatal stenosis.    MULTI-SYSTEM PHYSICAL EXAMINATION:    Constitutional: Well-nourished. No physical deformities. Normally developed. Good grooming.  Neck: Neck symmetrical, not swollen. Normal tracheal position.  Respiratory: No labored breathing, no use of accessory muscles.   Cardiovascular: Normal temperature, normal extremity pulses, no swelling, no varicosities.  Skin: No paleness, no jaundice, no cyanosis. No lesion, no ulcer, no  rash.  Neurologic / Psychiatric: Oriented to time, oriented to place, oriented to person. No depression, no anxiety, no agitation.  Gastrointestinal: No mass, no tenderness, no rigidity, non obese abdomen.  Eyes: Normal conjunctivae. Normal eyelids.  Ears, Nose, Mouth, and Throat: Left ear no scars, no lesions, no masses. Right ear no scars, no lesions, no masses. Nose no scars, no lesions, no masses. Normal hearing. Normal lips.  Musculoskeletal: Normal gait and station of head and neck.     PAST DATA REVIEWED:  Source Of History:  Patient  Urine Test Review:   Urinalysis  Notes:                     A urinalysis contaminated due to the fact that patient is unable to retract foreskin.    PROCEDURES:          Urinalysis w/Scope Dipstick Dipstick Cont'd Micro  Color: Yellow Bilirubin: Neg mg/dL WBC/hpf: 10 - 20/hpf  Appearance: Cloudy Ketones: 2+ mg/dL RBC/hpf: 3 - 10/hpf  Specific Gravity: 1.025 Blood: 1+ ery/uL Bacteria: Mod (26-50/hpf)  pH: <=5.0 Protein: 3+ mg/dL Cystals: NS (Not Seen)  Glucose: 2+ mg/dL Urobilinogen: 0.2 mg/dL Casts: NS (Not Seen)    Nitrites: Neg Trichomonas: Not Present    Leukocyte Esterase: 2+ leu/uL Mucous: Not Present      Epithelial Cells: 6 - 10/hpf      Yeast: Moderate (5 - 10/hpf)      Sperm: Not Present    ASSESSMENT:      ICD-10 Details  1 GU:   Phimosis - N47.1 Discussed management strategies for the phimosis including continued topical therapy, observation circumcision. The risks and benefits of a circumcision were discussed with the patient including bleeding, hematoma, infection, poor cosmetic result, damage to surrounding structures including the glans and urethra, pain. The patient understands these risks and wishes to proceed with surgery. I explained in detail had surgeries performed and what to expect postoperatively.  2 NON-GU:   Condyloma latum - A51.31 While you circumcision I will also excise some of the larger genital warts. The risks and  benefits of this were were also discussed with the patient including pain, infection, poor cosmetic result and bleeding. Patient understands that the work can recur.   PLAN:           Document Letter(s):  Created for Patient: Clinical Summary         Notes:   Patient to be scheduled for surgery with a postop visit 4 weeks after.

## 2019-06-21 ENCOUNTER — Encounter (HOSPITAL_BASED_OUTPATIENT_CLINIC_OR_DEPARTMENT_OTHER): Payer: Self-pay | Admitting: Certified Registered"

## 2019-06-21 ENCOUNTER — Other Ambulatory Visit: Payer: Self-pay

## 2019-06-21 ENCOUNTER — Encounter (HOSPITAL_BASED_OUTPATIENT_CLINIC_OR_DEPARTMENT_OTHER)
Admission: RE | Disposition: A | Discharge status: Home / Self Care | Payer: Self-pay | Source: Home / Self Care | Attending: Urology

## 2019-06-21 ENCOUNTER — Encounter (HOSPITAL_BASED_OUTPATIENT_CLINIC_OR_DEPARTMENT_OTHER): Payer: Self-pay | Admitting: Urology

## 2019-06-21 ENCOUNTER — Ambulatory Visit (HOSPITAL_BASED_OUTPATIENT_CLINIC_OR_DEPARTMENT_OTHER)
Admission: RE | Admit: 2019-06-21 | Discharge: 2019-06-21 | Disposition: A | Discharge status: Home / Self Care | Payer: Medicaid Other | Attending: Urology | Admitting: Urology

## 2019-06-21 DIAGNOSIS — E119 Type 2 diabetes mellitus without complications: Secondary | ICD-10-CM | POA: Diagnosis not present

## 2019-06-21 DIAGNOSIS — Z7984 Long term (current) use of oral hypoglycemic drugs: Secondary | ICD-10-CM | POA: Diagnosis not present

## 2019-06-21 DIAGNOSIS — F17200 Nicotine dependence, unspecified, uncomplicated: Secondary | ICD-10-CM | POA: Diagnosis not present

## 2019-06-21 DIAGNOSIS — Z5309 Procedure and treatment not carried out because of other contraindication: Secondary | ICD-10-CM | POA: Diagnosis not present

## 2019-06-21 DIAGNOSIS — N471 Phimosis: Secondary | ICD-10-CM | POA: Insufficient documentation

## 2019-06-21 LAB — POCT I-STAT, CHEM 8
BUN: 15 mg/dL (ref 6–20)
Calcium, Ion: 1.33 mmol/L (ref 1.15–1.40)
Chloride: 97 mmol/L — ABNORMAL LOW (ref 98–111)
Creatinine, Ser: 0.6 mg/dL — ABNORMAL LOW (ref 0.61–1.24)
Glucose, Bld: 315 mg/dL — ABNORMAL HIGH (ref 70–99)
HCT: 41 % (ref 39.0–52.0)
Hemoglobin: 13.9 g/dL (ref 13.0–17.0)
Potassium: 4.3 mmol/L (ref 3.5–5.1)
Sodium: 136 mmol/L (ref 135–145)
TCO2: 23 mmol/L (ref 22–32)

## 2019-06-21 SURGERY — CIRCUMCISION, ADULT
Anesthesia: General

## 2019-06-21 MED ORDER — MIDAZOLAM HCL 2 MG/2ML IJ SOLN
INTRAMUSCULAR | Status: AC
  Filled 2019-06-21: qty 2

## 2019-06-21 MED ORDER — ONDANSETRON HCL 4 MG/2ML IJ SOLN
INTRAMUSCULAR | Status: AC
  Filled 2019-06-21: qty 2

## 2019-06-21 MED ORDER — ACETAMINOPHEN 500 MG PO TABS
1000.0000 mg | ORAL_TABLET | Freq: Once | ORAL | Status: AC
  Administered 2019-06-21: 1000 mg via ORAL
  Filled 2019-06-21: qty 2

## 2019-06-21 MED ORDER — CEFAZOLIN SODIUM-DEXTROSE 2-4 GM/100ML-% IV SOLN
2.0000 g | INTRAVENOUS | Status: DC
  Filled 2019-06-21: qty 100

## 2019-06-21 MED ORDER — FENTANYL CITRATE (PF) 100 MCG/2ML IJ SOLN
INTRAMUSCULAR | Status: AC
  Filled 2019-06-21: qty 2

## 2019-06-21 MED ORDER — ACETAMINOPHEN 500 MG PO TABS
ORAL_TABLET | ORAL | Status: AC
  Filled 2019-06-21: qty 2

## 2019-06-21 MED ORDER — PROPOFOL 10 MG/ML IV BOLUS
INTRAVENOUS | Status: AC
  Filled 2019-06-21: qty 20

## 2019-06-21 MED ORDER — LIDOCAINE 2% (20 MG/ML) 5 ML SYRINGE
INTRAMUSCULAR | Status: AC
  Filled 2019-06-21: qty 5

## 2019-06-21 MED ORDER — LACTATED RINGERS IV SOLN
INTRAVENOUS | Status: DC
  Filled 2019-06-21: qty 1000

## 2019-06-21 MED ORDER — DEXAMETHASONE SODIUM PHOSPHATE 10 MG/ML IJ SOLN
INTRAMUSCULAR | Status: AC
  Filled 2019-06-21: qty 1

## 2019-06-21 MED ORDER — CEFAZOLIN SODIUM-DEXTROSE 2-4 GM/100ML-% IV SOLN
INTRAVENOUS | Status: AC
  Filled 2019-06-21: qty 100

## 2019-06-21 SURGICAL SUPPLY — 22 items
BLADE SURG 15 STRL LF DISP TIS (BLADE) IMPLANT
BLADE SURG 15 STRL SS (BLADE)
BNDG COHESIVE 1X5 TAN STRL LF (GAUZE/BANDAGES/DRESSINGS) IMPLANT
COVER BACK TABLE 60X90IN (DRAPES) IMPLANT
COVER MAYO STAND STRL (DRAPES) IMPLANT
COVER WAND RF STERILE (DRAPES) IMPLANT
DRAPE LAPAROTOMY T 98X78 PEDS (DRAPES) IMPLANT
GAUZE SPONGE 4X4 12PLY STRL (GAUZE/BANDAGES/DRESSINGS) IMPLANT
GAUZE XEROFORM 1X8 LF (GAUZE/BANDAGES/DRESSINGS) IMPLANT
GLOVE BIO SURGEON STRL SZ 6.5 (GLOVE) IMPLANT
GLOVE BIO SURGEONS STRL SZ 6.5 (GLOVE)
GOWN STRL REUS W/TWL LRG LVL3 (GOWN DISPOSABLE) IMPLANT
KIT TURNOVER CYSTO (KITS) IMPLANT
NEEDLE HYPO 22GX1.5 SAFETY (NEEDLE) IMPLANT
NS IRRIG 1000ML POUR BTL (IV SOLUTION) IMPLANT
PACK BASIN DAY SURGERY FS (CUSTOM PROCEDURE TRAY) IMPLANT
PENCIL SMOKE EVACUATOR (MISCELLANEOUS) IMPLANT
SUT CHROMIC 3 0 SH 27 (SUTURE) IMPLANT
SUT CHROMIC 4 0 SH 27 (SUTURE) IMPLANT
SYR BULB 3OZ (MISCELLANEOUS) IMPLANT
SYR CONTROL 10ML LL (SYRINGE) IMPLANT
TOWEL OR 17X26 10 PK STRL BLUE (TOWEL DISPOSABLE) IMPLANT

## 2019-06-21 NOTE — Progress Notes (Signed)
Patient's blood sugar 315. Dr Arita Miss and Dr Sampson Goon aware. Surgery cancelled to day. After sugars are down will reschedule

## 2019-06-21 NOTE — Anesthesia Preprocedure Evaluation (Deleted)
Anesthesia Evaluation  Patient identified by MRN, date of birth, ID band Patient awake    Reviewed: Allergy & Precautions, NPO status , Patient's Chart, lab work & pertinent test results  Airway Mallampati: II  TM Distance: >3 FB Neck ROM: Full    Dental  (+) Dental Advisory Given   Pulmonary Current Smoker,    breath sounds clear to auscultation       Cardiovascular negative cardio ROS   Rhythm:Regular Rate:Normal     Neuro/Psych negative neurological ROS     GI/Hepatic negative GI ROS, Neg liver ROS,   Endo/Other  diabetes, Type 2, Insulin Dependent  Renal/GU negative Renal ROS     Musculoskeletal   Abdominal   Peds  Hematology negative hematology ROS (+)   Anesthesia Other Findings   Reproductive/Obstetrics                             Lab Results  Component Value Date   WBC 5.7 04/30/2017   HGB 12.7 (L) 04/30/2017   HCT 36.3 (L) 04/30/2017   MCV 89.4 04/30/2017   PLT 410 (H) 04/30/2017   Lab Results  Component Value Date   CREATININE 0.80 04/30/2017   BUN <5 (L) 04/30/2017   NA 134 (L) 04/30/2017   K 4.0 04/30/2017   CL 97 (L) 04/30/2017   CO2 27 04/30/2017    Anesthesia Physical Anesthesia Plan  ASA: II  Anesthesia Plan: General   Post-op Pain Management:    Induction: Intravenous  PONV Risk Score and Plan: 1 and Dexamethasone, Ondansetron and Treatment may vary due to age or medical condition  Airway Management Planned: LMA  Additional Equipment:   Intra-op Plan:   Post-operative Plan: Extubation in OR  Informed Consent: I have reviewed the patients History and Physical, chart, labs and discussed the procedure including the risks, benefits and alternatives for the proposed anesthesia with the patient or authorized representative who has indicated his/her understanding and acceptance.     Dental advisory given  Plan Discussed with: CRNA  Anesthesia  Plan Comments:         Anesthesia Quick Evaluation

## 2019-07-06 DIAGNOSIS — E119 Type 2 diabetes mellitus without complications: Secondary | ICD-10-CM | POA: Insufficient documentation

## 2019-07-21 ENCOUNTER — Other Ambulatory Visit: Payer: Self-pay | Admitting: Internal Medicine

## 2019-07-21 ENCOUNTER — Ambulatory Visit
Admission: RE | Admit: 2019-07-21 | Discharge: 2019-07-21 | Disposition: A | Discharge status: Home / Self Care | Payer: Medicaid Other | Source: Ambulatory Visit | Attending: Internal Medicine | Admitting: Internal Medicine

## 2019-07-21 DIAGNOSIS — R109 Unspecified abdominal pain: Secondary | ICD-10-CM

## 2019-07-22 ENCOUNTER — Other Ambulatory Visit: Payer: Self-pay | Admitting: Internal Medicine

## 2019-07-22 DIAGNOSIS — K529 Noninfective gastroenteritis and colitis, unspecified: Secondary | ICD-10-CM

## 2020-01-03 DIAGNOSIS — N471 Phimosis: Secondary | ICD-10-CM | POA: Insufficient documentation

## 2020-01-18 ENCOUNTER — Other Ambulatory Visit: Payer: Self-pay

## 2020-01-18 ENCOUNTER — Ambulatory Visit: Payer: Medicaid Other | Admitting: Podiatry

## 2020-01-18 DIAGNOSIS — Z794 Long term (current) use of insulin: Secondary | ICD-10-CM | POA: Diagnosis not present

## 2020-01-18 DIAGNOSIS — M79675 Pain in left toe(s): Secondary | ICD-10-CM | POA: Diagnosis not present

## 2020-01-18 DIAGNOSIS — M79674 Pain in right toe(s): Secondary | ICD-10-CM | POA: Diagnosis not present

## 2020-01-18 DIAGNOSIS — E119 Type 2 diabetes mellitus without complications: Secondary | ICD-10-CM | POA: Diagnosis not present

## 2020-01-18 DIAGNOSIS — B351 Tinea unguium: Secondary | ICD-10-CM

## 2020-01-19 ENCOUNTER — Encounter: Payer: Self-pay | Admitting: Podiatry

## 2020-01-19 NOTE — Progress Notes (Signed)
  Subjective:  Patient ID: Albert [name], male    DOB: 1985/07/04,  MRN: [id number]  Chief Complaint  Patient presents with  . Diabetes    Pt requests diabetic foot care/diabetic foot exam. Pt could not recall recent A1c results. He reported that today's fasting AM glucose was 180mg /dL.  . Nail Problem    Bilateral - thick, long, discolored toenails  . Callouses    Painful callus - L plantar forefoot submet 2. x1 month.   34 y.o. male returns for the above complaint.  Patient presents with thickened elongated dystrophic toenails x10.  Patient states that he would like to debride them down as he is not able to do it himself.  He denies any other acute complaints.  He does not have any neuropathy or ulcers.  They are painful to touch.  Patient is a diabetic with unknown A1c.  Objective:  There were no vitals filed for this visit. Podiatric Exam: Vascular: dorsalis pedis and posterior tibial pulses are palpable bilateral. Capillary return is immediate. Temperature gradient is WNL. Skin turgor WNL  Sensorium: Normal Semmes Weinstein monofilament test. Normal tactile sensation bilaterally. Nail Exam: Pt has thick disfigured discolored nails with subungual debris noted bilateral entire nail hallux through fifth toenails.  Pain on palpation to the nails. Ulcer Exam: There is no evidence of ulcer or pre-ulcerative changes or infection. Orthopedic Exam: Muscle tone and strength are WNL. No limitations in general ROM. No crepitus or effusions noted. HAV  B/L.  Hammer toes 2-5  B/L. Skin: No Porokeratosis. No infection or ulcers    Assessment & Plan:   1. Pain due to onychomycosis of toenails of both feet   2. Controlled type 2 diabetes mellitus without complication, with long-term current use of insulin (HCC)     Patient was evaluated and treated and all questions answered.  Onychomycosis with pain  -Nails palliatively debrided as below. -Educated on self-care  Procedure: Nail  Debridement Rationale: pain  Type of Debridement: manual, sharp debridement. Instrumentation: Nail nipper, rotary burr. Number of Nails: 10  Procedures and Treatment: Consent by patient was obtained for treatment procedures. The patient understood the discussion of treatment and procedures well. All questions were answered thoroughly reviewed. Debridement of mycotic and hypertrophic toenails, 1 through 5 bilateral and clearing of subungual debris. No ulceration, no infection noted.  Return Visit-Office Procedure: Patient instructed to return to the office for a follow up visit 3 months for continued evaluation and treatment.  31, DPM    No follow-ups on file.

## 2020-04-18 ENCOUNTER — Ambulatory Visit: Payer: Medicaid Other | Admitting: Podiatry

## 2020-08-22 ENCOUNTER — Other Ambulatory Visit: Payer: Self-pay

## 2020-08-22 ENCOUNTER — Encounter: Payer: Self-pay | Admitting: Podiatry

## 2020-08-22 ENCOUNTER — Ambulatory Visit: Payer: Medicaid Other | Admitting: Podiatry

## 2020-08-22 DIAGNOSIS — Z794 Long term (current) use of insulin: Secondary | ICD-10-CM | POA: Diagnosis not present

## 2020-08-22 DIAGNOSIS — E119 Type 2 diabetes mellitus without complications: Secondary | ICD-10-CM | POA: Diagnosis not present

## 2020-08-22 DIAGNOSIS — B351 Tinea unguium: Secondary | ICD-10-CM

## 2020-08-22 DIAGNOSIS — M79675 Pain in left toe(s): Secondary | ICD-10-CM | POA: Diagnosis not present

## 2020-08-22 DIAGNOSIS — M79674 Pain in right toe(s): Secondary | ICD-10-CM

## 2020-08-22 DIAGNOSIS — Q828 Other specified congenital malformations of skin: Secondary | ICD-10-CM

## 2020-08-22 NOTE — Progress Notes (Signed)
  Subjective:  Patient ID: Albert [name], male    DOB: 05/15/1986,  MRN: [id number]  Chief Complaint  Patient presents with  . Callouses    Bilateral corns. PT stated that they have been there for about a month denies any pain   35 y.o. male returns for the above complaint.  Patient presents with thickened elongated dystrophic toenails x10.  Patient states that he would like to debride them down as he is not able to do it himself.  He denies any other acute complaints.  He does not have any neuropathy or ulcers.  They are painful to touch.  Patient is a diabetic with unknown A1c.  Patient also has secondary complaint of benign skin lesion to the right medial foot.  They are painful to walk on.  he would like to have them debrided down.  He denies any other acute complaints.  Objective:  There were no vitals filed for this visit. Podiatric Exam: Vascular: dorsalis pedis and posterior tibial pulses are palpable bilateral. Capillary return is immediate. Temperature gradient is WNL. Skin turgor WNL  Sensorium: Normal Semmes Weinstein monofilament test. Normal tactile sensation bilaterally. Nail Exam: Pt has thick disfigured discolored nails with subungual debris noted bilateral entire nail hallux through fifth toenails.  Pain on palpation to the nails. Ulcer Exam: There is no evidence of ulcer or pre-ulcerative changes or infection. Orthopedic Exam: Muscle tone and strength are WNL. No limitations in general ROM. No crepitus or effusions noted. HAV  B/L.  Hammer toes 2-5  B/L. Skin: Benign skin lesion/porokeratosis noted to the right medial foot.. No infection or ulcers    Assessment & Plan:   1. Controlled type 2 diabetes mellitus without complication, with long-term current use of insulin (HCC)   2. Pain due to onychomycosis of toenails of both feet   3. Porokeratosis     Patient was evaluated and treated and all questions answered.  Porokeratosis right medial foot/benign skin  lesion -I explained the patient the etiology of porokeratosis and various treatment options were extensively discussed.  Given that he is having a lot of pain because of the lesion I believe patient will benefit from debridement of the lesion.  Using chisel blade and handle the lesion was debrided down to healthy tissue.  No ulceration noted.  No pinpoint bleeding noted.  Onychomycosis with pain  -Nails palliatively debrided as below. -Educated on self-care  Procedure: Nail Debridement Rationale: pain  Type of Debridement: manual, sharp debridement. Instrumentation: Nail nipper, rotary burr. Number of Nails: 10  Procedures and Treatment: Consent by patient was obtained for treatment procedures. The patient understood the discussion of treatment and procedures well. All questions were answered thoroughly reviewed. Debridement of mycotic and hypertrophic toenails, 1 through 5 bilateral and clearing of subungual debris. No ulceration, no infection noted.  Return Visit-Office Procedure: Patient instructed to return to the office for a follow up visit 3 months for continued evaluation and treatment.  Nicholes Rough, DPM    No follow-ups on file.

## 2020-08-28 ENCOUNTER — Encounter: Payer: Self-pay | Admitting: Podiatry

## 2020-10-07 IMAGING — CR DG ABDOMEN 2V
4 series · 4 of 4 positions shown · non-contrast
Comparison: None

CLINICAL DATA: Lower abdominal pain, cramping and distension for 1
week, history type II diabetes mellitus

EXAM:
ABDOMEN - 2 VIEW

[w abdomen upright]
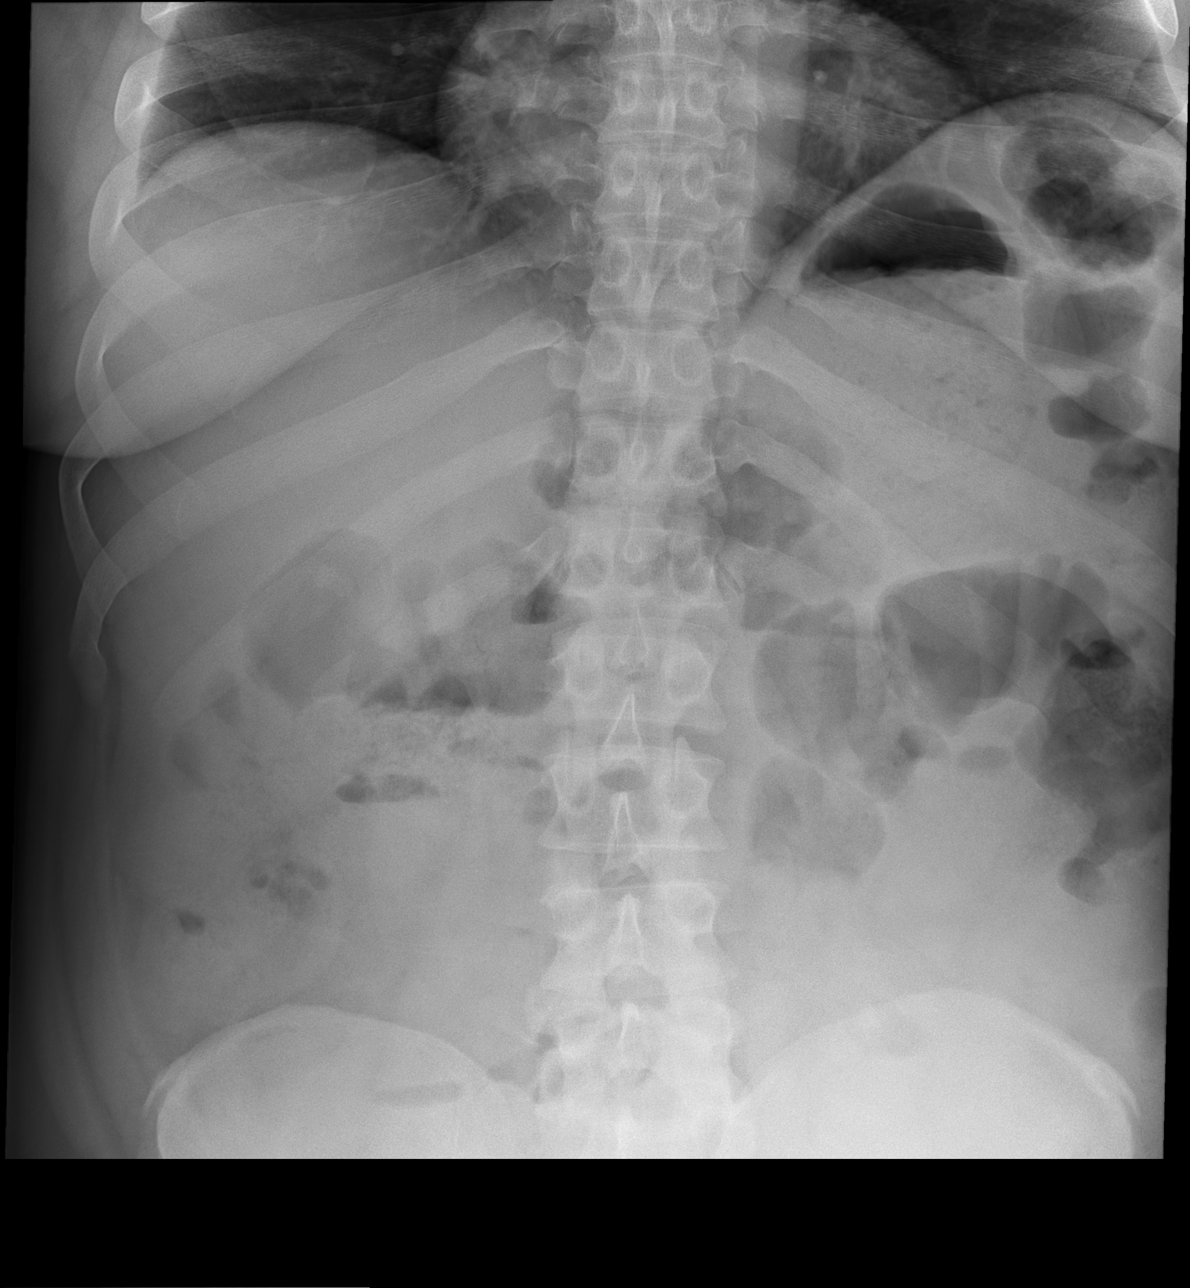

[t abdomen supine (1 of 3)]
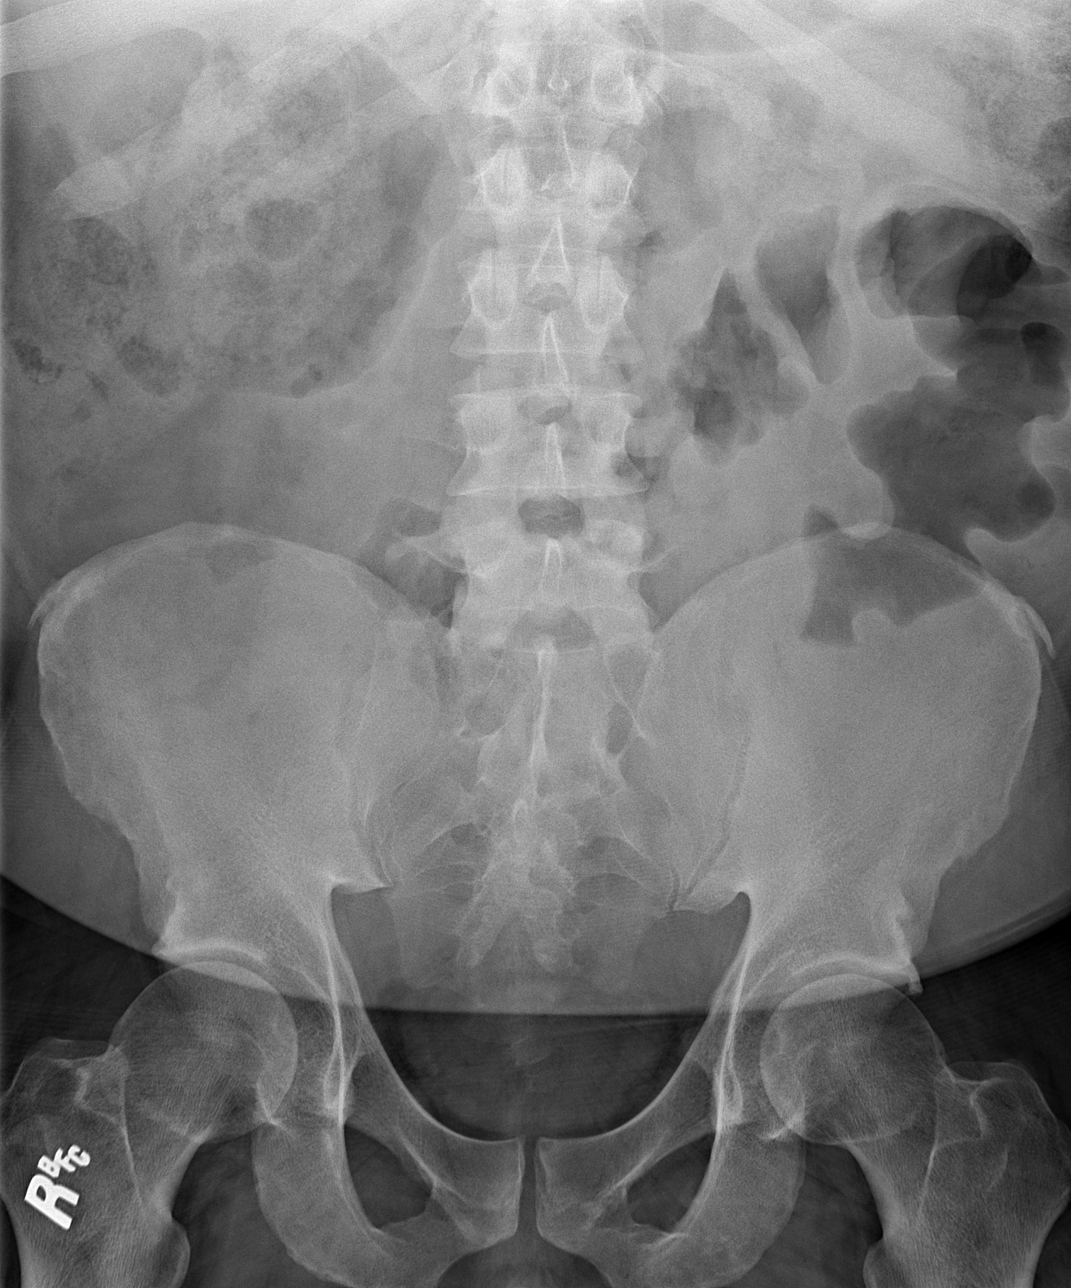

[t abdomen supine (2 of 3)]
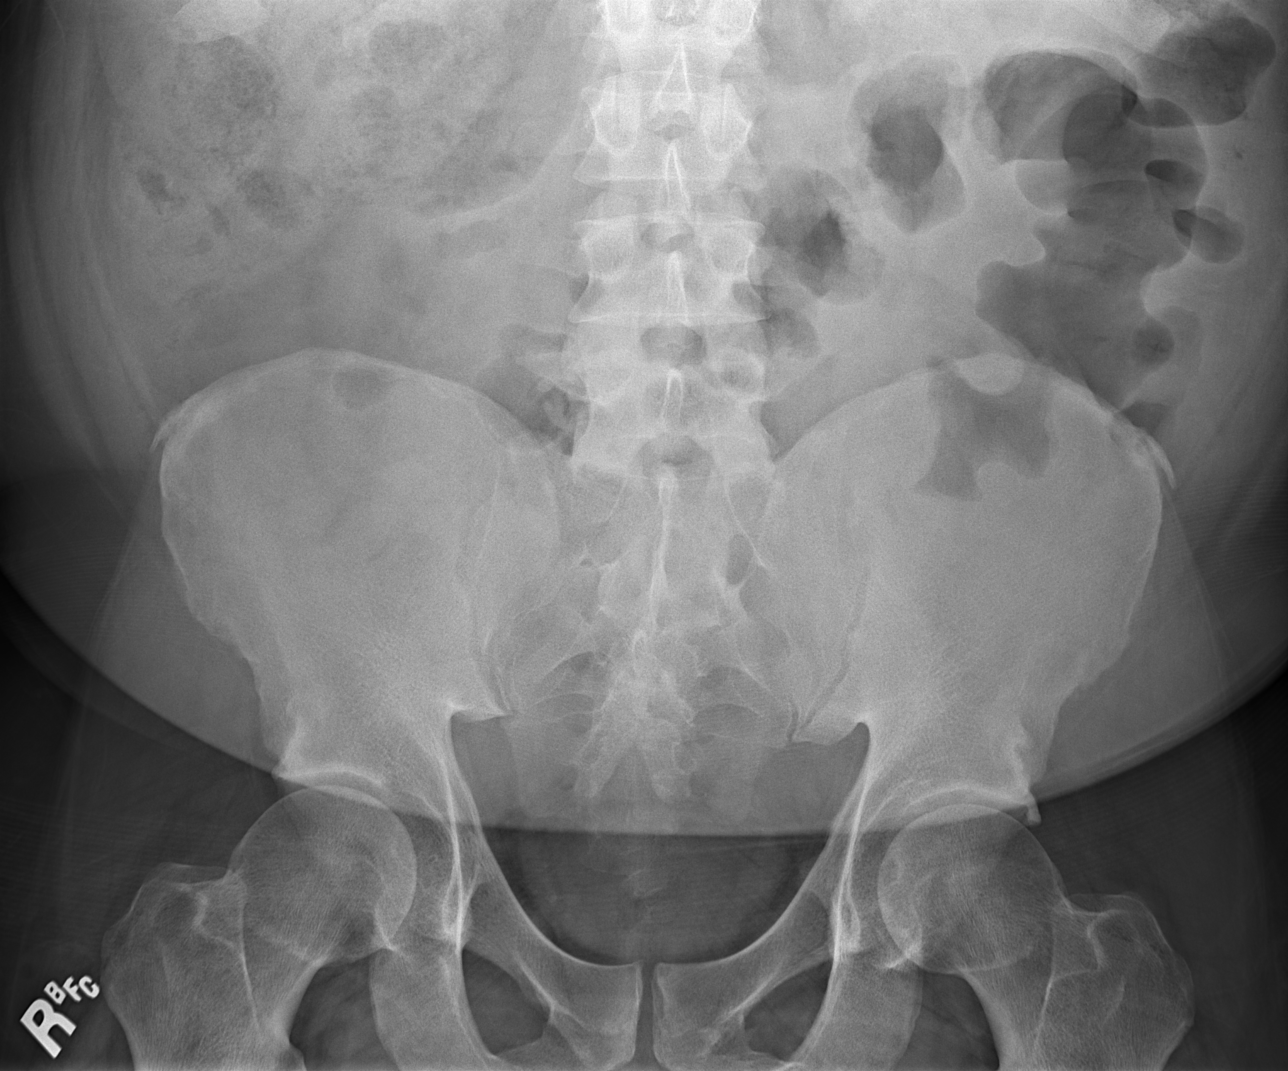

[t abdomen supine (3 of 3)]
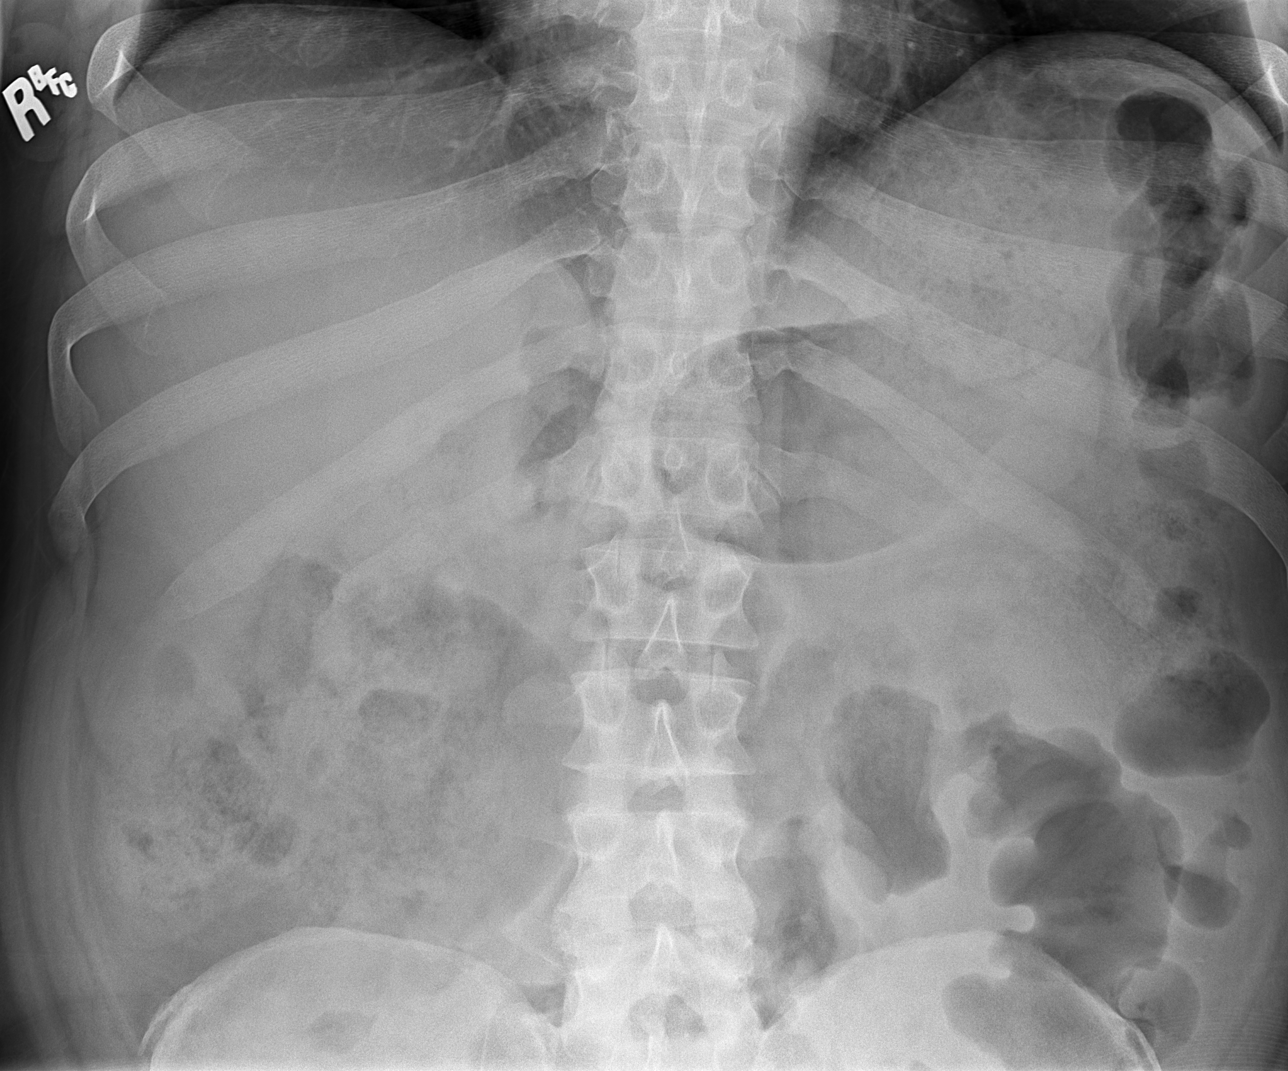

[4 of 4 positions shown; findings below may reference images not displayed]

FINDINGS: Prominent stool in the proximal half of the colon.

Suspected mild wall thickening of sigmoid loop with probable
thumbprinting; question of distal colitis is raised.

No free air.

Food debris in stomach.

Small bowel gas pattern normal without evidence of obstruction.

Osseous structures unremarkable.
IMPRESSION: Suspected wall thickening in the sigmoid colon raising question of
distal colitis; consider CT assessment with IV and oral contrast.

These results will be called to the ordering clinician or
representative by the Radiologist Assistant, and communication
documented in the PACS or z[facility] Dashboard.

## 2020-11-21 ENCOUNTER — Encounter: Payer: Self-pay | Admitting: Endocrinology

## 2020-11-28 ENCOUNTER — Ambulatory Visit (INDEPENDENT_AMBULATORY_CARE_PROVIDER_SITE_OTHER): Payer: Medicaid Other | Admitting: Podiatry

## 2020-11-28 ENCOUNTER — Other Ambulatory Visit: Payer: Self-pay

## 2020-11-28 ENCOUNTER — Encounter: Payer: Self-pay | Admitting: Podiatry

## 2020-11-28 DIAGNOSIS — B351 Tinea unguium: Secondary | ICD-10-CM | POA: Diagnosis not present

## 2020-11-28 DIAGNOSIS — M79675 Pain in left toe(s): Secondary | ICD-10-CM

## 2020-11-28 DIAGNOSIS — E119 Type 2 diabetes mellitus without complications: Secondary | ICD-10-CM

## 2020-11-28 DIAGNOSIS — Z794 Long term (current) use of insulin: Secondary | ICD-10-CM

## 2020-11-28 DIAGNOSIS — M79674 Pain in right toe(s): Secondary | ICD-10-CM | POA: Diagnosis not present

## 2020-11-28 DIAGNOSIS — Q828 Other specified congenital malformations of skin: Secondary | ICD-10-CM | POA: Diagnosis not present

## 2020-11-28 NOTE — Progress Notes (Signed)
Complaint:  °Visit Type: Patient returns to my office for continued preventative foot care services. Complaint: Patient states" my nails have grown long and thick and become painful to walk and wear shoes" Patient also has callus both feet.  Patient has been diagnosed with DM with no foot complications. The patient presents for preventative foot care services. ° °Podiatric Exam: °Vascular: dorsalis pedis and posterior tibial pulses are palpable bilateral. Capillary return is immediate. Temperature gradient is WNL. Skin turgor WNL  °Sensorium: Normal Semmes Weinstein monofilament test. Normal tactile sensation bilaterally. °Nail Exam: Pt has thick disfigured discolored nails with subungual debris noted bilateral entire nail hallux through fifth toenails °Ulcer Exam: There is no evidence of ulcer or pre-ulcerative changes or infection. °Orthopedic Exam: Muscle tone and strength are WNL. No limitations in general ROM. No crepitus or effusions noted. Foot type and digits show no abnormalities. Bony prominences are unremarkable. °Skin: Porokeratosis left froefoot and medial arch right foot.. No infection or ulcers ° °Diagnosis:  °Onychomycosis, , Pain in right toe, pain in left toes  Porokeratosis ° °Treatment & Plan °Procedures and Treatment: Consent by patient was obtained for treatment procedures.   Debridement of mycotic and hypertrophic toenails, 1 through 5 bilateral and clearing of subungual debris. Debride callus/porokeratosis  with # 15 blade.  No ulceration, no infection noted.  °Return Visit-Office Procedure: Patient instructed to return to the office for a follow up visit 3 months for continued evaluation and treatment. ° ° ° °Morning Halberg DPM  °

## 2021-03-01 ENCOUNTER — Other Ambulatory Visit: Payer: Self-pay

## 2021-03-01 ENCOUNTER — Ambulatory Visit: Payer: Medicaid Other | Admitting: Podiatry

## 2021-03-01 ENCOUNTER — Encounter: Payer: Self-pay | Admitting: Podiatry

## 2021-03-01 DIAGNOSIS — M79675 Pain in left toe(s): Secondary | ICD-10-CM | POA: Diagnosis not present

## 2021-03-01 DIAGNOSIS — E119 Type 2 diabetes mellitus without complications: Secondary | ICD-10-CM

## 2021-03-01 DIAGNOSIS — Q828 Other specified congenital malformations of skin: Secondary | ICD-10-CM

## 2021-03-01 DIAGNOSIS — B351 Tinea unguium: Secondary | ICD-10-CM

## 2021-03-01 DIAGNOSIS — Z794 Long term (current) use of insulin: Secondary | ICD-10-CM

## 2021-03-01 DIAGNOSIS — M79674 Pain in right toe(s): Secondary | ICD-10-CM

## 2021-03-01 NOTE — Progress Notes (Signed)
Complaint:  °Visit Type: Patient returns to my office for continued preventative foot care services. Complaint: Patient states" my nails have grown long and thick and become painful to walk and wear shoes" Patient also has callus both feet.  Patient has been diagnosed with DM with no foot complications. The patient presents for preventative foot care services. ° °Podiatric Exam: °Vascular: dorsalis pedis and posterior tibial pulses are palpable bilateral. Capillary return is immediate. Temperature gradient is WNL. Skin turgor WNL  °Sensorium: Normal Semmes Weinstein monofilament test. Normal tactile sensation bilaterally. °Nail Exam: Pt has thick disfigured discolored nails with subungual debris noted bilateral entire nail hallux through fifth toenails °Ulcer Exam: There is no evidence of ulcer or pre-ulcerative changes or infection. °Orthopedic Exam: Muscle tone and strength are WNL. No limitations in general ROM. No crepitus or effusions noted. Foot type and digits show no abnormalities. Bony prominences are unremarkable. °Skin: Porokeratosis left froefoot and medial arch right foot.. No infection or ulcers ° °Diagnosis:  °Onychomycosis, , Pain in right toe, pain in left toes  Porokeratosis ° °Treatment & Plan °Procedures and Treatment: Consent by patient was obtained for treatment procedures.   Debridement of mycotic and hypertrophic toenails, 1 through 5 bilateral and clearing of subungual debris. Debride callus/porokeratosis  with # 15 blade.  No ulceration, no infection noted.  °Return Visit-Office Procedure: Patient instructed to return to the office for a follow up visit 3 months for continued evaluation and treatment. ° ° ° °Sada Mazzoni DPM  °

## 2021-04-04 ENCOUNTER — Encounter: Payer: Self-pay | Admitting: Internal Medicine

## 2021-04-04 ENCOUNTER — Other Ambulatory Visit: Payer: Self-pay

## 2021-04-04 ENCOUNTER — Ambulatory Visit (INDEPENDENT_AMBULATORY_CARE_PROVIDER_SITE_OTHER): Payer: Medicaid Other | Admitting: Internal Medicine

## 2021-04-04 VITALS — BP 132/84 | HR 97 | Ht 69.0 in | Wt 246.0 lb

## 2021-04-04 DIAGNOSIS — Z794 Long term (current) use of insulin: Secondary | ICD-10-CM | POA: Insufficient documentation

## 2021-04-04 DIAGNOSIS — R739 Hyperglycemia, unspecified: Secondary | ICD-10-CM | POA: Diagnosis not present

## 2021-04-04 DIAGNOSIS — E785 Hyperlipidemia, unspecified: Secondary | ICD-10-CM | POA: Diagnosis not present

## 2021-04-04 DIAGNOSIS — E1165 Type 2 diabetes mellitus with hyperglycemia: Secondary | ICD-10-CM

## 2021-04-04 DIAGNOSIS — E1142 Type 2 diabetes mellitus with diabetic polyneuropathy: Secondary | ICD-10-CM | POA: Diagnosis not present

## 2021-04-04 DIAGNOSIS — R809 Proteinuria, unspecified: Secondary | ICD-10-CM

## 2021-04-04 LAB — BASIC METABOLIC PANEL
BUN: 13 mg/dL (ref 6–23)
CO2: 31 mEq/L (ref 19–32)
Calcium: 9.5 mg/dL (ref 8.4–10.5)
Chloride: 102 mEq/L (ref 96–112)
Creatinine, Ser: 0.9 mg/dL (ref 0.40–1.50)
GFR: 109.56 mL/min (ref >60.00)
Glucose, Bld: 180 mg/dL — ABNORMAL HIGH (ref 70–99)
Potassium: 4.5 mEq/L (ref 3.5–5.1)
Sodium: 138 mEq/L (ref 135–145)

## 2021-04-04 LAB — POCT GLYCOSYLATED HEMOGLOBIN (HGB A1C): Hemoglobin A1C: 11.8 % — AB (ref 4.0–5.6)

## 2021-04-04 LAB — LIPID PANEL
Cholesterol: 188 mg/dL (ref 0–200)
HDL: 54.5 mg/dL (ref >39.00)
LDL Cholesterol: 124 mg/dL — ABNORMAL HIGH (ref 0–99)
NonHDL: 133.66
Total CHOL/HDL Ratio: 3
Triglycerides: 48 mg/dL (ref 0.0–149.0)
VLDL: 9.6 mg/dL (ref 0.0–40.0)

## 2021-04-04 LAB — MICROALBUMIN / CREATININE URINE RATIO
Creatinine,U: 106.8 mg/dL
Microalb Creat Ratio: 86.8 mg/g — ABNORMAL HIGH (ref 0.0–30.0)
Microalb, Ur: 92.7 mg/dL — ABNORMAL HIGH (ref 0.0–1.9)

## 2021-04-04 LAB — GLUCOSE, POCT (MANUAL RESULT ENTRY): POC Glucose: 243 mg/dl — AB (ref 70–99)

## 2021-04-04 MED ORDER — DAPAGLIFLOZIN PROPANEDIOL 10 MG PO TABS: 10.0000 mg | ORAL_TABLET | Freq: Every day | ORAL | 3 refills | Status: DC

## 2021-04-04 MED ORDER — TRULICITY 3 MG/0.5ML ~~LOC~~ SOAJ: 3.0000 mg | SUBCUTANEOUS | 3 refills | Status: DC

## 2021-04-04 MED ORDER — INSULIN LISPRO PROT & LISPRO (75-25 MIX) 100 UNIT/ML KWIKPEN: 50.0000 [IU] | PEN_INJECTOR | Freq: Two times a day (BID) | SUBCUTANEOUS | 6 refills | Status: DC

## 2021-04-04 MED ORDER — INSULIN PEN NEEDLE 31G X 5 MM MISC: 1.0000 | Freq: Two times a day (BID) | 3 refills | Status: DC

## 2021-04-04 NOTE — Progress Notes (Signed)
Name: Albert [name]  MRN/ DOB: [id number], [birth year]   Age/ Sex: 35 y.o., male    PCP: Harvest Forest, MD   Reason for Endocrinology Evaluation: Type 2 Diabetes Mellitus     Date of Initial Endocrinology Visit: 04/04/2021     PATIENT IDENTIFIER: Albert [name] is a 35 y.o. male with a past medical history of T2DM, schizophrenia  and dyslipidemia . The patient presented for initial endocrinology clinic visit on 04/04/2021 for consultative assistance with his diabetes management.    HPI: Albert [name] was    Diagnosed with DM at age 15  Prior Medications tried/Intolerance: Metformin- diarrhea  Currently checking blood sugars 1 x / day through [facility] - he forgot his receiver today  Hypoglycemia episodes : yes               Symptoms: yes                 Frequency: occasionally  Hemoglobin A1c has ranged from 11.9% in 2021, peaking at 12.9 % in 2020. Patient required assistance for hypoglycemia: no Patient has required hospitalization within the last 1 year from hyper or hypoglycemia: no  In terms of diet, the patient eats 2 meals a day, does not snack.   Has dexcom at home    Denies diarrhea at this time, denies nausea or vomiting   Lives alone  Drinks 40 every other day   HOME DIABETES REGIMEN: Farxiga 10 mg daily  Novolin 70/30 50 units BID on vials  Trulicity 1.5 mg weekly ( Tuesdays) Glimepiride 2 mg  Metformin 500 mg - not taking      Statin: yes ACE-I/ARB: yes Prior Diabetic Education: yes     METER DOWNLOAD SUMMARY: did not bring     DIABETIC COMPLICATIONS: Microvascular complications:  Neuropathy Denies: CKD, retinopathy Last eye exam: Completed 03/2021  Macrovascular complications:   Denies: CAD, PVD, CVA   PAST HISTORY: Past Medical History:  Past Medical History:  Diagnosis Date   History of asthma    per pt as child   Phimosis    Type 2 diabetes mellitus treated with insulin (HCC)    followed by pcp---  per pt checks sugar  daily AM,  fasting average-- 190   Past Surgical History:  Past Surgical History:  Procedure Laterality Date   NO PAST SURGERIES      Social History:  reports that he has been smoking cigarettes. He has a 4.00 pack-year smoking history. He has never used smokeless tobacco. He reports current alcohol use. He reports that he does not use drugs. Family History: No family history on file.   HOME MEDICATIONS: Allergies as of 04/04/2021       Reactions   Milk-related Compounds    itching        Medication List        Accurate as of April 04, 2021 10:13 AM. If you have any questions, ask your nurse or doctor.          STOP taking these medications    insulin glargine 100 UNIT/ML Solostar Pen Commonly known as: LANTUS Stopped by: Scarlette Shorts, MD   liraglutide 18 MG/3ML Sopn Commonly known as: VICTOZA Stopped by: Scarlette Shorts, MD   metFORMIN 1000 MG tablet Commonly known as: GLUCOPHAGE Stopped by: Scarlette Shorts, MD   metFORMIN 500 MG 24 hr tablet Commonly known as: GLUCOPHAGE-XR Stopped by: Scarlette Shorts, MD       TAKE these medications  atorvastatin 10 MG tablet Commonly known as: LIPITOR atorvastatin 10 mg tablet  TAKE 1 TABLET BY MOUTH ONCE DAILY IN THE EVENING FOR 90 DAYS   cholecalciferol 25 MCG (1000 UNIT) tablet Commonly known as: VITAMIN D3 Take 1,000 Units by mouth daily.   ciprofloxacin 500 MG tablet Commonly known as: CIPRO ciprofloxacin 500 mg tablet  TAKE 1 TABLET BY MOUTH EVERY 12 HOURS FOR 10 DAYS   dapagliflozin propanediol 10 MG Tabs tablet Commonly known as: FARXIGA Farxiga 10 mg tablet  Take 1 tablet every day by oral route at noon for 90 days.   ergocalciferol 1.25 MG (50000 UT) capsule Commonly known as: VITAMIN D2 Take 1 capsule by mouth once a week.   fluticasone 50 MCG/ACT nasal spray Commonly known as: FLONASE fluticasone propionate 50 mcg/actuation nasal spray,suspension  USE 1 SPRAY(S)  IN EACH NOSTRIL ONCE DAILY   folic acid 1 MG tablet Commonly known as: FOLVITE folic acid 1 mg tablet  TAKE 1 TABLET BY MOUTH ONCE DAILY IN THE MORNING   glimepiride 2 MG tablet Commonly known as: AMARYL glimepiride 2 mg tablet  Take 1 tablet twice a day by oral route for 90 days.   haloperidol 5 MG tablet Commonly known as: HALDOL haloperidol 5 mg tablet  Take 1 tablet every day by oral route at bedtime.   haloperidol decanoate 50 MG/ML injection Commonly known as: HALDOL DECANOATE Inject into the muscle.   insulin NPH-regular Human (70-30) 100 UNIT/ML injection Inject 48 Units into the skin daily with breakfast.   ketoconazole 2 % cream Commonly known as: NIZORAL ketoconazole 2 % topical cream  APPLY TO THE AFFECTED AREA(S) BY TOPICAL ROUTE - between your toes 2x a day   losartan 50 MG tablet Commonly known as: COZAAR losartan 50 mg tablet  TAKE 1 TABLET BY MOUTH ONCE DAILY IN THE MORNING   metroNIDAZOLE 500 MG tablet Commonly known as: FLAGYL metronidazole 500 mg tablet  TAKE 1 TABLET BY MOUTH EVERY 8 HOURS FOR 10 DAYS   rosuvastatin 20 MG tablet Commonly known as: CRESTOR rosuvastatin 20 mg tablet  TAKE 1 TABLET BY MOUTH ONCE DAILY IN THE EVENING   triamcinolone cream 0.1 % Commonly known as: KENALOG triamcinolone acetonide 0.1 % topical cream  APPLY 1 APPLICATION TOPICALLY TWICE DAILY to eczema on your left elbow   Trulicity 3 MG/0.5ML Sopn Generic drug: Dulaglutide Trulicity 3 mg/0.5 mL subcutaneous pen injector  Inject 3 mg every week by subcutaneous route.         ALLERGIES: Allergies  Allergen Reactions   Milk-Related Compounds     itching     REVIEW OF SYSTEMS: A comprehensive ROS was conducted with the patient and is negative except as per HPI     OBJECTIVE:   VITAL SIGNS: BP 132/84 (BP Location: Left Arm, Patient Position: Sitting, Cuff Size: Large)   Pulse 97   Ht 5\' 9"  (1.753 m)   Wt 246 lb (111.6 kg)   SpO2 98%   BMI 36.33  kg/m    PHYSICAL EXAM:  General: Pt appears well and is in NAD  Neck: General: Supple without adenopathy or carotid bruits. Thyroid: Thyroid size normal.  No goiter or nodules appreciated.  Lungs: Clear with good BS bilat with no rales, rhonchi, or wheezes  Heart: RRR   Abdomen: Normoactive bowel sounds, soft, nontender, without masses or organomegaly palpable  Extremities:  Lower extremities - No pretibial edema. No lesions.  Neuro: MS is good with appropriate affect, pt is alert  and Ox3       DATA REVIEWED:  Lab Results  Component Value Date   HGBA1C 11.8 (A) 04/04/2021    Results for SAMANYU, TINNELL (MRN [id number]) as of 04/05/2021 12:49  Ref. Range 04/04/2021 10:31  Sodium Latest Ref Range: 135 - 145 mEq/L 138  Potassium Latest Ref Range: 3.5 - 5.1 mEq/L 4.5  Chloride Latest Ref Range: 96 - 112 mEq/L 102  CO2 Latest Ref Range: 19 - 32 mEq/L 31  Glucose Latest Ref Range: 70 - 99 mg/dL 220 (H)  BUN Latest Ref Range: 6 - 23 mg/dL 13  Creatinine Latest Ref Range: 0.40 - 1.50 mg/dL 2.54  Calcium Latest Ref Range: 8.4 - 10.5 mg/dL 9.5  GFR Latest Ref Range: >60.00 mL/min 109.56  Total CHOL/HDL Ratio Unknown 3  Cholesterol Latest Ref Range: 0 - 200 mg/dL 270  HDL Cholesterol Latest Ref Range: >39.00 mg/dL 62.37  LDL (calc) Latest Ref Range: 0 - 99 mg/dL 628 (H)  MICROALB/CREAT RATIO Latest Ref Range: 0.0 - 30.0 mg/g 86.8 (H)  NonHDL Unknown 133.66  Triglycerides Latest Ref Range: 0.0 - 149.0 mg/dL 31.5  VLDL Latest Ref Range: 0.0 - 40.0 mg/dL 9.6   ASSESSMENT / PLAN / RECOMMENDATIONS:   1) Type 2 Diabetes Mellitus, Poorly controlled, With Neuropathic  complications and microalbuminuria- Most recent A1c of 11.8 %. Goal A1c < 7.0 %.    Plan: GENERAL: I have discussed with the patient the pathophysiology of diabetes. We went over the natural progression of the disease. We talked about both insulin resistance and insulin deficiency. We stressed the importance of lifestyle  changes including diet and exercise. I explained the complications associated with diabetes including retinopathy, nephropathy, neuropathy as well as increased risk of cardiovascular disease. We went over the benefit seen with glycemic control.   I explained to the patient that diabetic patients are at higher than normal risk for amputations.  Barriers to diabetes care is schizophrenia/mental illness  With an A1c of 11.8% I doubt that this patient is taking his medications consistently, he was unable to tell me exact names of medications, he did not bring a list of medications and I am just going based on his PCP is med list He also did not bring his Dexcom receiver so we had no glucose data He reports diarrhea with metformin, will stop I am also going to stop his glimepiride due to increased risk of hypoglycemia in conjunction with insulin I am going to switch his insulin to an insulin analog and in pens, currently using vials  MEDICATIONS: Stop metformin Stop glimepiride Increase Novolin mix/Humalog mix to 60 units before breakfast and 60 units before supper Continue Farxiga 10 mg 1 tablet daily Continue Trulicity 3 mg weekly  EDUCATION / INSTRUCTIONS: BG monitoring instructions: Patient is instructed to check his blood sugars 3 times a day, before meals. Call [facility] if: BG persistently < 70  I reviewed the Rule of 15 for the treatment of hypoglycemia in detail with the patient. Literature supplied.   2) Diabetic complications:  Eye: Does not have known diabetic retinopathy.  Neuro/ Feet: Does  have known diabetic peripheral neuropathy. Renal: Patient does not have known baseline CKD. He is  on an ACEI/ARB at present.  3) Dyslipidemia:    -LDL slightly elevated at 124 mg/DL -No changes at this time we will continue to monitor  Medication Continue atorvastatin 10 mg daily   4) microalbuminuria:  -His MA/CR ratio is elevated as above -Patient already  on  losartan -We will encourage glucose control   Signed electronically by: Lyndle Herrlich, MD  [facility]  [facility] [address] [place], [place] [address] Phone: [phone] FAX: [phone]   CC: Harvest Forest, MD [address] [place] [address] Phone: [phone]  Fax: [phone]    Return to Endocrinology clinic as below: Future Appointments  Date Time Provider Department Center  05/31/2021  7:45 AM Helane Gunther, DPM [facility]

## 2021-04-04 NOTE — Patient Instructions (Addendum)
-   STOP Metformin  - STOP Glimepiride  - Continue Farxiga 10 , 1 tablet daily  - Increase Novolin/Humalog 70/30 to 60 units before Breakfast and 60 units before Supper  - Continue Trulicity  3 mg weekly     HOW TO TREAT LOW BLOOD SUGARS (Blood sugar LESS THAN 70 MG/DL) Please follow the RULE OF 15 for the treatment of hypoglycemia treatment (when your (blood sugars are less than 70 mg/dL)   STEP 1: Take 15 grams of carbohydrates when your blood sugar is low, which includes:  3-4 GLUCOSE TABS  OR 3-4 OZ OF JUICE OR REGULAR SODA OR ONE TUBE OF GLUCOSE GEL    STEP 2: RECHECK blood sugar in 15 MINUTES STEP 3: If your blood sugar is still low at the 15 minute recheck --> then, go back to STEP 1 and treat AGAIN with another 15 grams of carbohydrates.

## 2021-04-05 ENCOUNTER — Encounter: Payer: Self-pay | Admitting: Internal Medicine

## 2021-04-05 DIAGNOSIS — R809 Proteinuria, unspecified: Secondary | ICD-10-CM | POA: Insufficient documentation

## 2021-04-05 MED ORDER — ATORVASTATIN CALCIUM 10 MG PO TABS: 10.0000 mg | ORAL_TABLET | Freq: Every day | ORAL | 3 refills | Status: DC

## 2021-04-09 ENCOUNTER — Telehealth: Payer: Self-pay | Admitting: Pharmacy Technician

## 2021-04-09 ENCOUNTER — Other Ambulatory Visit (HOSPITAL_COMMUNITY): Payer: Self-pay

## 2021-04-09 NOTE — Telephone Encounter (Signed)
Patient Advocate Encounter  Received notification from [employer] that prior authorization for [facility] 10MG  is required.   PA submitted on 11.1.22 THROUGH [employer]  Received notification from [facility] regarding a prior authorization for [facility] 10MG . Authorization has been APPROVED from 11.1.22 to 11.1.23.   Per test claim, copay for 30 days supply is $4   Authorization # [id number]    [facility] will continue to follow  [phone], CPhT Patient Advocate [facility] Phone: [phone] Fax:  [phone]

## 2021-05-31 ENCOUNTER — Encounter: Payer: Self-pay | Admitting: Podiatry

## 2021-05-31 ENCOUNTER — Ambulatory Visit (INDEPENDENT_AMBULATORY_CARE_PROVIDER_SITE_OTHER): Payer: Medicaid Other | Admitting: Podiatry

## 2021-05-31 ENCOUNTER — Other Ambulatory Visit: Payer: Self-pay

## 2021-05-31 DIAGNOSIS — Q828 Other specified congenital malformations of skin: Secondary | ICD-10-CM

## 2021-05-31 DIAGNOSIS — M79675 Pain in left toe(s): Secondary | ICD-10-CM

## 2021-05-31 DIAGNOSIS — B351 Tinea unguium: Secondary | ICD-10-CM | POA: Diagnosis not present

## 2021-05-31 DIAGNOSIS — E119 Type 2 diabetes mellitus without complications: Secondary | ICD-10-CM

## 2021-05-31 DIAGNOSIS — M79674 Pain in right toe(s): Secondary | ICD-10-CM | POA: Diagnosis not present

## 2021-05-31 DIAGNOSIS — Z794 Long term (current) use of insulin: Secondary | ICD-10-CM

## 2021-05-31 NOTE — Progress Notes (Signed)
Complaint:  Visit Type: Patient returns to my office for continued preventative foot care services. Complaint: Patient states" my nails have grown long and thick and become painful to walk and wear shoes" Patient also has callus both feet.  Patient has been diagnosed with DM with no foot complications. The patient presents for preventative foot care services.  Podiatric Exam: Vascular: dorsalis pedis and posterior tibial pulses are palpable bilateral. Capillary return is immediate. Temperature gradient is WNL. Skin turgor WNL  Sensorium: Normal Semmes Weinstein monofilament test. Normal tactile sensation bilaterally. Nail Exam: Pt has thick disfigured discolored nails with subungual debris noted bilateral entire nail hallux through fifth toenails Ulcer Exam: There is no evidence of ulcer or pre-ulcerative changes or infection. Orthopedic Exam: Muscle tone and strength are WNL. No limitations in general ROM. No crepitus or effusions noted. Foot type and digits show no abnormalities. Bony prominences are unremarkable. Skin: Porokeratosis left froefoot and medial arch right foot.. No infection or ulcers  Diagnosis:  Onychomycosis, , Pain in right toe, pain in left toes  Porokeratosis  Treatment & Plan Procedures and Treatment: Consent by patient was obtained for treatment procedures.   Debridement of mycotic and hypertrophic toenails, 1 through 5 bilateral and clearing of subungual debris. Debride callus/porokeratosis  with # 15 blade.  No ulceration, no infection noted.  Return Visit-Office Procedure: Patient instructed to return to the office for a follow up visit 3 months for continued evaluation and treatment.    Helane Gunther DPM

## 2021-07-05 ENCOUNTER — Ambulatory Visit: Payer: Medicaid Other | Admitting: Internal Medicine

## 2021-07-05 NOTE — Progress Notes (Deleted)
Name: Damarrion Mimbs  MRN/ DOB: [id number], Jul 16, 1985   Age/ Sex: 36 y.o., male    PCP: Harvest Forest, MD   Reason for Endocrinology Evaluation: Type 2 Diabetes Mellitus     Date of Initial Endocrinology Visit: 04/04/2021    PATIENT IDENTIFIER: Mr. Taige Housman is a 36 y.o. male with a past medical history of T2DM, schizophrenia  and dyslipidemia . The patient presented for initial endocrinology clinic visit on 04/04/2021 for consultative assistance with his diabetes management.    HPI: Mr. Roesler was    Diagnosed with DM at age 43  Prior Medications tried/Intolerance: Metformin- diarrhea  Hemoglobin A1c has ranged from 11.9% in 2021, peaking at 12.9 % in 2020. Lives alone  Drinks 40 every other day    On his initial visit to our clinic he had an A1c of 11.8%, he was on Farxiga, Novolin mix, Trulicity, metformin, and glimepiride.   We stopped metformin due to diarrhea 03/2021 We stopped glimepiride, increase Novolin mix, continued [place] and Trulicity  SUBJECTIVE:   During the last visit (04/04/2021): A1c 11.8%, stopped glimepiride and metformin adjusted insulin mix, continued [place] and Trulicity  Today (07/05/21): Beck Cofer is here for follow-up on diabetes management.  He checks his sugar blood sugars *** times daily. The patient has *** had hypoglycemic episodes since the last clinic visit, which typically occur *** x / - most often occuring ***. The patient is *** symptomatic with these episodes, with symptoms of {symptoms; hypoglycemia:9084048}.       HOME DIABETES REGIMEN: Farxiga 10 mg daily  Novolin 70/30 60 units BID on vials  Trulicity 3 mg weekly ( Tuesdays)      Statin: yes ACE-I/ARB: yes Prior Diabetic Education: yes     METER DOWNLOAD SUMMARY: did not bring     DIABETIC COMPLICATIONS: Microvascular complications:  Neuropathy Denies: CKD, retinopathy Last eye exam: Completed 03/2021  Macrovascular complications:    Denies: CAD, PVD, CVA   PAST HISTORY: Past Medical History:  Past Medical History:  Diagnosis Date   History of asthma    per pt as child   Phimosis    Type 2 diabetes mellitus treated with insulin (HCC)    followed by pcp---  per pt checks sugar daily AM,  fasting average-- 190   Past Surgical History:  Past Surgical History:  Procedure Laterality Date   NO PAST SURGERIES      Social History:  reports that he has been smoking cigarettes. He has a 4.00 pack-year smoking history. He has never used smokeless tobacco. He reports current alcohol use. He reports that he does not use drugs. Family History: No family history on file.   HOME MEDICATIONS: Allergies as of 07/05/2021       Reactions   Milk-related Compounds    itching        Medication List        Accurate as of July 05, 2021  8:52 AM. If you have any questions, ask your nurse or doctor.          atorvastatin 10 MG tablet Commonly known as: LIPITOR Take 1 tablet (10 mg total) by mouth daily.   cholecalciferol 25 MCG (1000 UNIT) tablet Commonly known as: VITAMIN D3 Take 1,000 Units by mouth daily.   ciprofloxacin 500 MG tablet Commonly known as: CIPRO ciprofloxacin 500 mg tablet  TAKE 1 TABLET BY MOUTH EVERY 12 HOURS FOR 10 DAYS   dapagliflozin propanediol 10 MG Tabs tablet Commonly known as: FARXIGA Take 1  tablet (10 mg total) by mouth daily.   Dexcom G6 Receiver Devi Dexcom G6 Receiver   ergocalciferol 1.25 MG (50000 UT) capsule Commonly known as: VITAMIN D2 Take 1 capsule by mouth once a week.   fluticasone 50 MCG/ACT nasal spray Commonly known as: FLONASE fluticasone propionate 50 mcg/actuation nasal spray,suspension  USE 1 SPRAY(S) IN EACH NOSTRIL ONCE DAILY   folic acid 1 MG tablet Commonly known as: FOLVITE folic acid 1 mg tablet  TAKE 1 TABLET BY MOUTH ONCE DAILY IN THE MORNING   gabapentin 300 MG capsule Commonly known as: NEURONTIN gabapentin 300 mg capsule  Take 1  capsule every day by oral route at bedtime for 30 days.   haloperidol 5 MG tablet Commonly known as: HALDOL haloperidol 5 mg tablet  Take 1 tablet every day by oral route at bedtime.   haloperidol decanoate 50 MG/ML injection Commonly known as: HALDOL DECANOATE Inject into the muscle.   HumuLIN 70/30 (70-30) 100 UNIT/ML injection Generic drug: insulin NPH-regular Human Humulin 70/30 U-100 Insulin 100 unit/mL subcutaneous suspension  INJECT 60 UNITS TWICE A DAY BY SUBCUTANEOUS ROUTE AS DIRECTED   Insulin Lispro Prot & Lispro (75-25) 100 UNIT/ML Kwikpen Commonly known as: HumaLOG Mix 75/25 KwikPen Inject 50 Units into the skin 2 (two) times daily before a meal. To replace novolin mix and Glimepiride   Insulin Pen Needle 31G X 5 MM Misc 1 Device by Does not apply route in the morning and at bedtime.   ketoconazole 2 % cream Commonly known as: NIZORAL ketoconazole 2 % topical cream  APPLY TO THE AFFECTED AREA(S) BY TOPICAL ROUTE - between your toes 2x a day   losartan 50 MG tablet Commonly known as: COZAAR losartan 50 mg tablet  TAKE 1 TABLET BY MOUTH ONCE DAILY IN THE MORNING   metroNIDAZOLE 500 MG tablet Commonly known as: FLAGYL metronidazole 500 mg tablet  TAKE 1 TABLET BY MOUTH EVERY 8 HOURS FOR 10 DAYS   rosuvastatin 20 MG tablet Commonly known as: CRESTOR rosuvastatin 20 mg tablet  TAKE 1 TABLET BY MOUTH ONCE DAILY IN THE EVENING   triamcinolone cream 0.1 % Commonly known as: KENALOG triamcinolone acetonide 0.1 % topical cream  APPLY 1 APPLICATION TOPICALLY TWICE DAILY to eczema on your left elbow   Trulicity 3 MG/0.5ML Sopn Generic drug: Dulaglutide Inject 3 mg into the skin once a week.         ALLERGIES: Allergies  Allergen Reactions   Milk-Related Compounds     itching     REVIEW OF SYSTEMS: A comprehensive ROS was conducted with the patient and is negative except as per HPI     OBJECTIVE:   VITAL SIGNS: There were no vitals taken for this  visit.   PHYSICAL EXAM:  General: Pt appears well and is in NAD  Neck: General: Supple without adenopathy or carotid bruits. Thyroid: Thyroid size normal.  No goiter or nodules appreciated.  Lungs: Clear with good BS bilat with no rales, rhonchi, or wheezes  Heart: RRR   Abdomen: Normoactive bowel sounds, soft, nontender, without masses or organomegaly palpable  Extremities:  Lower extremities - No pretibial edema. No lesions.  Neuro: MS is good with appropriate affect, pt is alert and Ox3       DATA REVIEWED:  Lab Results  Component Value Date   HGBA1C 11.8 (A) 04/04/2021    Results for Elmarie MainlandMCNAIR, Hakeem (MRN [id number]) as of 04/05/2021 12:49  Ref. Range 04/04/2021 10:31  Sodium Latest Ref Range: 135 -  145 mEq/L 138  Potassium Latest Ref Range: 3.5 - 5.1 mEq/L 4.5  Chloride Latest Ref Range: 96 - 112 mEq/L 102  CO2 Latest Ref Range: 19 - 32 mEq/L 31  Glucose Latest Ref Range: 70 - 99 mg/dL 604180 (H)  BUN Latest Ref Range: 6 - 23 mg/dL 13  Creatinine Latest Ref Range: 0.40 - 1.50 mg/dL 5.400.90  Calcium Latest Ref Range: 8.4 - 10.5 mg/dL 9.5  GFR Latest Ref Range: >60.00 mL/min 109.56  Total CHOL/HDL Ratio Unknown 3  Cholesterol Latest Ref Range: 0 - 200 mg/dL 981188  HDL Cholesterol Latest Ref Range: >39.00 mg/dL 19.1454.50  LDL (calc) Latest Ref Range: 0 - 99 mg/dL 782124 (H)  MICROALB/CREAT RATIO Latest Ref Range: 0.0 - 30.0 mg/g 86.8 (H)  NonHDL Unknown 133.66  Triglycerides Latest Ref Range: 0.0 - 149.0 mg/dL 95.648.0  VLDL Latest Ref Range: 0.0 - 40.0 mg/dL 9.6   ASSESSMENT / PLAN / RECOMMENDATIONS:   1) Type 2 Diabetes Mellitus, Poorly controlled, With Neuropathic  complications and microalbuminuria- Most recent A1c of 11.8 %. Goal A1c < 7.0 %.    Plan: GENERAL: I have discussed with the patient the pathophysiology of diabetes. We went over the natural progression of the disease. We talked about both insulin resistance and insulin deficiency. We stressed the importance of lifestyle  changes including diet and exercise. I explained the complications associated with diabetes including retinopathy, nephropathy, neuropathy as well as increased risk of cardiovascular disease. We went over the benefit seen with glycemic control.   I explained to the patient that diabetic patients are at higher than normal risk for amputations.  Barriers to diabetes care is schizophrenia/mental illness  With an A1c of 11.8% I doubt that this patient is taking his medications consistently, he was unable to tell me exact names of medications, he did not bring a list of medications and I am just going based on his PCP is med list He also did not bring his Dexcom receiver so we had no glucose data He reports diarrhea with metformin, will stop I am also going to stop his glimepiride due to increased risk of hypoglycemia in conjunction with insulin I am going to switch his insulin to an insulin analog and in pens, currently using vials  MEDICATIONS: Stop metformin Stop glimepiride Increase Novolin mix/Humalog mix to 60 units before breakfast and 60 units before supper Continue Farxiga 10 mg 1 tablet daily Continue Trulicity 3 mg weekly  EDUCATION / INSTRUCTIONS: BG monitoring instructions: Patient is instructed to check his blood sugars 3 times a day, before meals. Call [facility] if: BG persistently < 70  I reviewed the Rule of 15 for the treatment of hypoglycemia in detail with the patient. Literature supplied.   2) Diabetic complications:  Eye: Does not have known diabetic retinopathy.  Neuro/ Feet: Does  have known diabetic peripheral neuropathy. Renal: Patient does not have known baseline CKD. He is  on an ACEI/ARB at present.  3) Dyslipidemia:    -LDL slightly elevated at 124 mg/DL -No changes at this time we will continue to monitor  Medication Continue atorvastatin 10 mg daily   4) microalbuminuria:  -His MA/CR ratio is elevated as above -Patient already on  losartan -We will encourage glucose control   Signed electronically by: Lyndle HerrlichAbby Jaralla Giovany Cosby, MD  [facility]  [facility] [address] [place], [place] [address] Phone: [phone] FAX: [phone]   CC: Harvest ForestBakare, Mobolaji B, MD [id number]  [place] [address] Phone: [phone]  Fax: [phone]    Return to Endocrinology clinic as below: Future Appointments  Date Time Provider Department Center  07/05/2021  9:50 AM Judah Carchi, Konrad Dolores, MD [facility] None  08/30/2021  9:30 AM Helane Gunther, DPM [facility]

## 2021-08-30 ENCOUNTER — Ambulatory Visit: Payer: Medicaid Other | Admitting: Podiatry

## 2021-09-03 ENCOUNTER — Ambulatory Visit: Payer: Medicaid Other | Admitting: Podiatry

## 2021-12-18 ENCOUNTER — Encounter: Payer: Self-pay | Admitting: Internal Medicine

## 2021-12-18 ENCOUNTER — Ambulatory Visit (INDEPENDENT_AMBULATORY_CARE_PROVIDER_SITE_OTHER): Payer: Medicaid Other | Admitting: Internal Medicine

## 2021-12-18 VITALS — BP 124/70 | HR 96 | Ht 69.0 in | Wt 227.0 lb

## 2021-12-18 DIAGNOSIS — Z794 Long term (current) use of insulin: Secondary | ICD-10-CM

## 2021-12-18 DIAGNOSIS — E1165 Type 2 diabetes mellitus with hyperglycemia: Secondary | ICD-10-CM | POA: Diagnosis not present

## 2021-12-18 LAB — POCT GLUCOSE (DEVICE FOR HOME USE): Glucose Fasting, POC: 246 mg/dL — AB (ref 70–99)

## 2021-12-18 LAB — POCT GLYCOSYLATED HEMOGLOBIN (HGB A1C): Hemoglobin A1C: 13.1 % — AB (ref 4.0–5.6)

## 2021-12-18 MED ORDER — DAPAGLIFLOZIN PROPANEDIOL 10 MG PO TABS: 10.0000 mg | ORAL_TABLET | Freq: Every day | ORAL | 3 refills | Status: AC

## 2021-12-18 MED ORDER — INSULIN PEN NEEDLE 31G X 5 MM MISC: 1.0000 | Freq: Two times a day (BID) | 2 refills | Status: AC

## 2021-12-18 MED ORDER — INSULIN LISPRO PROT & LISPRO (75-25 MIX) 100 UNIT/ML KWIKPEN: 65.0000 [IU] | PEN_INJECTOR | Freq: Two times a day (BID) | SUBCUTANEOUS | 6 refills | Status: AC

## 2021-12-18 NOTE — Progress Notes (Signed)
Name: Albert [name]  MRN/ DOB: [id number], 1985-08-05   Age/ Sex: 36 y.o., male    PCP: Harvest Forest, MD   Reason for Endocrinology Evaluation: Type 2 Diabetes Mellitus     Date of Initial Endocrinology Visit: 04/04/2022    PATIENT IDENTIFIER: [name] is a 36 y.o. male with a past medical history of T2DM, schizophrenia  and dyslipidemia . The patient presented for initial endocrinology clinic visit on 04/04/2022 for consultative assistance with his diabetes management.    HPI: Mr. Vandergriff was    Diagnosed with DM at age 25  Prior Medications tried/Intolerance: Metformin- diarrhea  Hemoglobin A1c has ranged from 11.9% in 2021, peaking at 12.9 % in 2020.   Drinks 40 every other day    On his initial visit to our clinic he had an A1c of 11.8%, he was on metformin which was stopped due to diarrhea.  We stopped glimepiride due to the fact that he was on insulin mix.  We adjusted insulin mix, continued [place] and Trulicity    SUBJECTIVE:   During the last visit (04/04/2022): A1c 11.8% we increased his insulin, stop metformin and glimepiride, continued [place] and Trulicity  Today (12/18/21): Albert [name] is here for diabetes management.  He has not been to our clinic in 8 months.He  checks his blood sugars multiple  times daily, through CGM but he tells me he has not been using it in 2 weeks . The patient has  had hypoglycemic episodes since the last clinic visit,  he not sure when it happens.   He is not sure if he is taking farxiga  He did not take any insulin yesterday but he tells me he took it this morning without eating    Denies nausea, vomiting or diarrhea   HOME DIABETES REGIMEN: Farxiga 10 mg daily  Novolin 70/30 60 units BID  Trulicity 3 mg weekly ( Tuesdays)     Statin: yes ACE-I/ARB: yes Prior Diabetic Education: yes     METER DOWNLOAD SUMMARY: did not bring     DIABETIC COMPLICATIONS: Microvascular complications:   Neuropathy Denies: CKD, retinopathy Last eye exam: Completed 03/2021  Macrovascular complications:   Denies: CAD, PVD, CVA   PAST HISTORY: Past Medical History:  Past Medical History:  Diagnosis Date   History of asthma    per pt as child   Phimosis    Type 2 diabetes mellitus treated with insulin (HCC)    followed by pcp---  per pt checks sugar daily AM,  fasting average-- 190   Past Surgical History:  Past Surgical History:  Procedure Laterality Date   NO PAST SURGERIES      Social History:  reports that he has been smoking cigarettes. He has a 4.00 pack-year smoking history. He has never used smokeless tobacco. He reports current alcohol use. He reports that he does not use drugs. Family History: No family history on file.   HOME MEDICATIONS: Allergies as of 12/18/2021       Reactions   Milk-related Compounds    itching        Medication List        Accurate as of December 18, 2021  7:42 AM. If you have any questions, ask your nurse or doctor.          STOP taking these medications    ergocalciferol 1.25 MG (50000 UT) capsule Commonly known as: VITAMIN D2 Stopped by: Scarlette Shorts, MD       TAKE these  medications    amLODipine 10 MG tablet Commonly known as: NORVASC Take 10 mg by mouth daily.   atorvastatin 10 MG tablet Commonly known as: LIPITOR Take 1 tablet (10 mg total) by mouth daily.   cholecalciferol 25 MCG (1000 UNIT) tablet Commonly known as: VITAMIN D3 Take 1,000 Units by mouth daily.   ciprofloxacin 500 MG tablet Commonly known as: CIPRO ciprofloxacin 500 mg tablet  TAKE 1 TABLET BY MOUTH EVERY 12 HOURS FOR 10 DAYS   dapagliflozin propanediol 10 MG Tabs tablet Commonly known as: FARXIGA Take 1 tablet (10 mg total) by mouth daily.   Dexcom G6 Receiver Devi Dexcom G6 Receiver   fluticasone 50 MCG/ACT nasal spray Commonly known as: FLONASE fluticasone propionate 50 mcg/actuation nasal spray,suspension  USE 1 SPRAY(S)  IN EACH NOSTRIL ONCE DAILY   folic acid 1 MG tablet Commonly known as: FOLVITE folic acid 1 mg tablet  TAKE 1 TABLET BY MOUTH ONCE DAILY IN THE MORNING   gabapentin 300 MG capsule Commonly known as: NEURONTIN gabapentin 300 mg capsule  Take 1 capsule every day by oral route at bedtime for 30 days.   haloperidol 5 MG tablet Commonly known as: HALDOL haloperidol 5 mg tablet  Take 1 tablet every day by oral route at bedtime.   haloperidol decanoate 50 MG/ML injection Commonly known as: HALDOL DECANOATE Inject into the muscle.   HumuLIN 70/30 (70-30) 100 UNIT/ML injection Generic drug: insulin NPH-regular Human Humulin 70/30 U-100 Insulin 100 unit/mL subcutaneous suspension  INJECT 60 UNITS TWICE A DAY BY SUBCUTANEOUS ROUTE AS DIRECTED   Insulin Lispro Prot & Lispro (75-25) 100 UNIT/ML Kwikpen Commonly known as: HumaLOG Mix 75/25 KwikPen Inject 50 Units into the skin 2 (two) times daily before a meal. To replace novolin mix and Glimepiride What changed: how much to take   Insulin Pen Needle 31G X 5 MM Misc 1 Device by Does not apply route in the morning and at bedtime.   ketoconazole 2 % cream Commonly known as: NIZORAL ketoconazole 2 % topical cream  APPLY TO THE AFFECTED AREA(S) BY TOPICAL ROUTE - between your toes 2x a day   losartan 50 MG tablet Commonly known as: COZAAR losartan 50 mg tablet  TAKE 1 TABLET BY MOUTH ONCE DAILY IN THE MORNING   metroNIDAZOLE 500 MG tablet Commonly known as: FLAGYL metronidazole 500 mg tablet  TAKE 1 TABLET BY MOUTH EVERY 8 HOURS FOR 10 DAYS   rosuvastatin 20 MG tablet Commonly known as: CRESTOR rosuvastatin 20 mg tablet  TAKE 1 TABLET BY MOUTH ONCE DAILY IN THE EVENING   triamcinolone cream 0.1 % Commonly known as: KENALOG triamcinolone acetonide 0.1 % topical cream  APPLY 1 APPLICATION TOPICALLY TWICE DAILY to eczema on your left elbow   Trulicity 3 MG/0.5ML Sopn Generic drug: Dulaglutide Inject 3 mg into the skin once a  week.         ALLERGIES: Allergies  Allergen Reactions   Milk-Related Compounds     itching     REVIEW OF SYSTEMS: A comprehensive ROS was conducted with the patient and is negative except as per HPI     OBJECTIVE:   VITAL SIGNS: BP 124/70 (BP Location: Left Arm, Patient Position: Sitting, Cuff Size: Large)   Pulse 96   Ht 5\' 9"  (1.753 m)   Wt 227 lb (103 kg)   SpO2 100%   BMI 33.52 kg/m    PHYSICAL EXAM:  General: Pt appears well and is in NAD  Neck: General: Supple without  adenopathy or carotid bruits. Thyroid: Thyroid size normal.  No goiter or nodules appreciated.  Lungs: Clear with good BS bilat with no rales, rhonchi, or wheezes  Heart: RRR   Abdomen: Normoactive bowel sounds, soft, nontender, without masses or organomegaly palpable  Extremities:  Lower extremities - No pretibial edema. No lesions.  Neuro: MS is good with appropriate affect, pt is alert and Ox3       DATA REVIEWED:  Lab Results  Component Value Date   HGBA1C 11.8 (A) 04/04/2021    Results for ANTHONYMICHAEL, MUNDAY (MRN [id number]) as of 04/05/2021 12:49  Ref. Range 04/04/2021 10:31  Sodium Latest Ref Range: 135 - 145 mEq/L 138  Potassium Latest Ref Range: 3.5 - 5.1 mEq/L 4.5  Chloride Latest Ref Range: 96 - 112 mEq/L 102  CO2 Latest Ref Range: 19 - 32 mEq/L 31  Glucose Latest Ref Range: 70 - 99 mg/dL 546 (H)  BUN Latest Ref Range: 6 - 23 mg/dL 13  Creatinine Latest Ref Range: 0.40 - 1.50 mg/dL 5.03  Calcium Latest Ref Range: 8.4 - 10.5 mg/dL 9.5  GFR Latest Ref Range: >60.00 mL/min 109.56  Total CHOL/HDL Ratio Unknown 3  Cholesterol Latest Ref Range: 0 - 200 mg/dL 546  HDL Cholesterol Latest Ref Range: >39.00 mg/dL 56.81  LDL (calc) Latest Ref Range: 0 - 99 mg/dL 275 (H)  MICROALB/CREAT RATIO Latest Ref Range: 0.0 - 30.0 mg/g 86.8 (H)  NonHDL Unknown 133.66  Triglycerides Latest Ref Range: 0.0 - 149.0 mg/dL 17.0  VLDL Latest Ref Range: 0.0 - 40.0 mg/dL 9.6     In office BG 017  mg/dL  ASSESSMENT / PLAN / RECOMMENDATIONS:   1) Type 2 Diabetes Mellitus, Poorly controlled, With Neuropathic  complications and microalbuminuria- Most recent A1c of 13.1 %. Goal A1c < 7.0 %.    He again  did not bring his Dexcom information today Poorly controlled diabetes due to medication non-adherence   Pt took insulin this AM but did not eat breakfast, discussed importance of taking this insulin before his first and last meal of the day  We also discussed the importance of having glucose data and to bring his CGM in the future  Intolerant to  metformin We discussed risk of microvascular complications with blindness, ESRD and increased risk of amputation with persistent hyperglycemia  I am not changing his dose of insulin, the issue is not the dose but the fact that he does not take it   MEDICATIONS: Continue Humalog mix  65 units before breakfast and 65 units before supper Continue Farxiga 10 mg 1 tablet daily Continue Trulicity 3 mg weekly  EDUCATION / INSTRUCTIONS: BG monitoring instructions: Patient is instructed to check his blood sugars 3 times a day, before meals. Call [facility] if: BG persistently < 70  I reviewed the Rule of 15 for the treatment of hypoglycemia in detail with the patient. Literature supplied.   2) Diabetic complications:  Eye: Does not have known diabetic retinopathy.  Neuro/ Feet: Does  have known diabetic peripheral neuropathy. Renal: Patient does not have known baseline CKD. He is  on an ACEI/ARB at present.  3) Dyslipidemia:    -LDL slightly elevated at 124 mg/DL -No changes at this time we will continue to monitor  Medication Continue atorvastatin 10 mg daily   4) microalbuminuria:  -His MA/CR ratio is elevated as above -Patient already on losartan -We will encourage glucose control   Signed electronically by: Lyndle Herrlich, MD  [facility]  [facility] [address],  [address] [place], [place] [address] Phone: [phone] FAX: [phone]   CC: Harvest Forest, MD [address] [place] [address] Phone: [phone]  Fax: [phone]    Return to Endocrinology clinic as below: No future appointments.

## 2021-12-18 NOTE — Patient Instructions (Signed)
-   Continue Farxiga 10 , 1 tablet daily  - Continue Humalog Mix   65 units before Breakfast and 65 units before Supper  - Continue Trulicity  3 mg weekly     HOW TO TREAT LOW BLOOD SUGARS (Blood sugar LESS THAN 70 MG/DL) Please follow the RULE OF 15 for the treatment of hypoglycemia treatment (when your (blood sugars are less than 70 mg/dL)   STEP 1: Take 15 grams of carbohydrates when your blood sugar is low, which includes:  3-4 GLUCOSE TABS  OR 3-4 OZ OF JUICE OR REGULAR SODA OR ONE TUBE OF GLUCOSE GEL    STEP 2: RECHECK blood sugar in 15 MINUTES STEP 3: If your blood sugar is still low at the 15 minute recheck --> then, go back to STEP 1 and treat AGAIN with another 15 grams of carbohydrates.

## 2021-12-23 ENCOUNTER — Other Ambulatory Visit (HOSPITAL_COMMUNITY): Payer: Self-pay

## 2021-12-26 ENCOUNTER — Telehealth: Payer: Self-pay | Admitting: Pharmacy Technician

## 2021-12-26 ENCOUNTER — Other Ambulatory Visit (HOSPITAL_COMMUNITY): Payer: Self-pay

## 2021-12-26 NOTE — Telephone Encounter (Signed)
Patient Advocate Encounter   Received notification from CoverMyMeds that prior authorization for Trulicity 3mg  is required/requested.   PA submitted on 12/26/21 to Medicaid via [facility] Tracks Confirmation [facility] Status is pending  Pharmacy Patient Advocate Fax:  [phone]

## 2022-01-28 ENCOUNTER — Other Ambulatory Visit (HOSPITAL_COMMUNITY): Payer: Self-pay

## 2022-01-28 NOTE — Telephone Encounter (Signed)
Patient Advocate Encounter  Prior Authorization for Trulicity 3mg  has been approved.    Effective dates: 12/26/21 through 12/26/22  Per Test Claim Patients co-pay is $4.   Spoke with Pharmacy to Process.  Patient Advocate Fax:  [phone]

## 2022-01-28 NOTE — Telephone Encounter (Signed)
Patient father was confused and thought we were calling regarding him. I spoke patient and he is taking the Trulicity and Humalog mix.

## 2022-03-28 ENCOUNTER — Encounter: Payer: Self-pay | Admitting: Podiatry

## 2022-03-28 ENCOUNTER — Ambulatory Visit: Payer: Medicaid Other | Admitting: Podiatry

## 2022-03-28 DIAGNOSIS — M79674 Pain in right toe(s): Secondary | ICD-10-CM | POA: Diagnosis not present

## 2022-03-28 DIAGNOSIS — B351 Tinea unguium: Secondary | ICD-10-CM

## 2022-03-28 DIAGNOSIS — Q828 Other specified congenital malformations of skin: Secondary | ICD-10-CM

## 2022-03-28 DIAGNOSIS — E119 Type 2 diabetes mellitus without complications: Secondary | ICD-10-CM

## 2022-03-28 DIAGNOSIS — M79675 Pain in left toe(s): Secondary | ICD-10-CM | POA: Diagnosis not present

## 2022-03-28 DIAGNOSIS — Z794 Long term (current) use of insulin: Secondary | ICD-10-CM

## 2022-03-28 NOTE — Progress Notes (Signed)
Complaint:  Visit Type: Patient returns to my office for continued preventative foot care services. Complaint: Patient states" my nails have grown long and thick and become painful to walk and wear shoes" Patient also has callus  right  feet.  Patient has been diagnosed with DM with no foot complications. The patient presents for preventative foot care services.  Podiatric Exam: Vascular: dorsalis pedis and posterior tibial pulses are palpable bilateral. Capillary return is immediate. Temperature gradient is WNL. Skin turgor WNL  Sensorium: Normal Semmes Weinstein monofilament test. Normal tactile sensation bilaterally. Nail Exam: Pt has thick disfigured discolored nails with subungual debris noted bilateral entire nail hallux through fifth toenails Ulcer Exam: There is no evidence of ulcer or pre-ulcerative changes or infection. Orthopedic Exam: Muscle tone and strength are WNL. No limitations in general ROM. No crepitus or effusions noted. Foot type and digits show no abnormalities. Bony prominences are unremarkable. Skin: Porokeratosis medial arch right foot.. No infection or ulcers  Diagnosis:  Onychomycosis, , Pain in right toe, pain in left toes  Porokeratosis  Treatment & Plan Procedures and Treatment: Consent by patient was obtained for treatment procedures.   Debridement of mycotic and hypertrophic toenails, 1 through 5 bilateral and clearing of subungual debris. Debride callus/porokeratosis  with # 15 blade.  No ulceration, no infection noted.  Return Visit-Office Procedure: Patient instructed to return to the office for a follow up visit prn for continued evaluation and treatment.    Gardiner Barefoot DPM

## 2022-04-01 ENCOUNTER — Other Ambulatory Visit: Payer: Self-pay | Admitting: Family Medicine

## 2022-04-01 DIAGNOSIS — R7401 Elevation of levels of liver transaminase levels: Secondary | ICD-10-CM

## 2022-04-15 ENCOUNTER — Other Ambulatory Visit: Payer: Medicaid Other

## 2022-04-21 ENCOUNTER — Other Ambulatory Visit: Payer: Medicaid Other

## 2022-04-21 ENCOUNTER — Ambulatory Visit
Admission: RE | Admit: 2022-04-21 | Discharge: 2022-04-21 | Disposition: A | Discharge status: Home / Self Care | Payer: Medicaid Other | Source: Ambulatory Visit | Attending: Family Medicine | Admitting: Family Medicine

## 2022-04-21 DIAGNOSIS — R7401 Elevation of levels of liver transaminase levels: Secondary | ICD-10-CM

## 2022-05-09 ENCOUNTER — Ambulatory Visit: Payer: Medicaid Other | Admitting: Internal Medicine

## 2022-05-09 DIAGNOSIS — E1165 Type 2 diabetes mellitus with hyperglycemia: Secondary | ICD-10-CM

## 2022-05-09 NOTE — Progress Notes (Deleted)
Name: Albert [name]  MRN/ DOB: [id number], 09-14-1985   Age/ Sex: 36 y.o., male    PCP: Albert Forest, MD   Reason for Endocrinology Evaluation: Type 2 Diabetes Mellitus     Date of Initial Endocrinology Visit: 04/04/2022    PATIENT IDENTIFIER: Albert [name] is a 35 y.o. male with a past medical history of T2DM, schizophrenia  and dyslipidemia . The patient presented for initial endocrinology clinic visit on 04/04/2022 for consultative assistance with his diabetes management.    HPI: Albert [name] was    Diagnosed with DM at age 10  Prior Medications tried/Intolerance: Metformin- diarrhea  Hemoglobin A1c has ranged from 11.9% in 2021, peaking at 12.9 % in 2020.   Drinks 40 every other day    On his initial visit to our clinic he had an A1c of 11.8%, he was on metformin which was stopped due to diarrhea.  We stopped glimepiride due to the fact that he was on insulin mix.  We adjusted insulin mix, continued [place] and Trulicity    SUBJECTIVE:   During the last visit (12/18/2021): A1c 13%    Today (05/09/22): Albert [name] is here for diabetes management.He checks his blood sugars multiple  times daily, through CGM but he tells me he has not been using it in 2 weeks . The patient has  had hypoglycemic episodes since the last clinic visit,  he not sure when it happens.   He was seen by podiatry in 03/28/2022 Denies nausea, vomiting or diarrhea   HOME DIABETES REGIMEN: Farxiga 10 mg daily  Novolin 70/30 65 units BID  Trulicity 3 mg weekly ( Tuesdays)     Statin: yes ACE-I/ARB: yes Prior Diabetic Education: yes     METER DOWNLOAD SUMMARY: did not bring     DIABETIC COMPLICATIONS: Microvascular complications:  Neuropathy Denies: CKD, retinopathy Last eye exam: Completed 03/2021  Macrovascular complications:   Denies: CAD, PVD, CVA   PAST HISTORY: Past Medical History:  Past Medical History:  Diagnosis Date   History of asthma    per pt as  child   Phimosis    Type 2 diabetes mellitus treated with insulin (HCC)    followed by pcp---  per pt checks sugar daily AM,  fasting average-- 190   Past Surgical History:  Past Surgical History:  Procedure Laterality Date   NO PAST SURGERIES      Social History:  reports that he has been smoking cigarettes. He has a 4.00 pack-year smoking history. He has never used smokeless tobacco. He reports current alcohol use. He reports that he does not use drugs. Family History: No family history on file.   HOME MEDICATIONS: Allergies as of 05/09/2022       Reactions   Milk-related Compounds    itching        Medication List        Accurate as of May 09, 2022  6:51 AM. If you have any questions, ask your nurse or doctor.          amLODipine 10 MG tablet Commonly known as: NORVASC Take 10 mg by mouth daily.   atorvastatin 10 MG tablet Commonly known as: LIPITOR Take 1 tablet (10 mg total) by mouth daily.   cholecalciferol 25 MCG (1000 UNIT) tablet Commonly known as: VITAMIN D3 Take 1,000 Units by mouth daily.   ciprofloxacin 500 MG tablet Commonly known as: CIPRO ciprofloxacin 500 mg tablet  TAKE 1 TABLET BY MOUTH EVERY 12 HOURS FOR 10 DAYS  dapagliflozin propanediol 10 MG Tabs tablet Commonly known as: FARXIGA Take 1 tablet (10 mg total) by mouth daily.   Dexcom G6 Receiver Devi Dexcom G6 Receiver   fluticasone 50 MCG/ACT nasal spray Commonly known as: FLONASE fluticasone propionate 50 mcg/actuation nasal spray,suspension  USE 1 SPRAY(S) IN EACH NOSTRIL ONCE DAILY   folic acid 1 MG tablet Commonly known as: FOLVITE folic acid 1 mg tablet  TAKE 1 TABLET BY MOUTH ONCE DAILY IN THE MORNING   gabapentin 300 MG capsule Commonly known as: NEURONTIN gabapentin 300 mg capsule  Take 1 capsule every day by oral route at bedtime for 30 days.   haloperidol 5 MG tablet Commonly known as: HALDOL haloperidol 5 mg tablet  Take 1 tablet every day by oral route  at bedtime.   haloperidol decanoate 50 MG/ML injection Commonly known as: HALDOL DECANOATE Inject into the muscle.   Insulin Lispro Prot & Lispro (75-25) 100 UNIT/ML Kwikpen Commonly known as: HumaLOG Mix 75/25 KwikPen Inject 65 Units into the skin 2 (two) times daily before a meal. To replace novolin mix and Glimepiride   Insulin Pen Needle 31G X 5 MM Misc 1 Device by Does not apply route in the morning and at bedtime.   ketoconazole 2 % cream Commonly known as: NIZORAL ketoconazole 2 % topical cream  APPLY TO THE AFFECTED AREA(S) BY TOPICAL ROUTE - between your toes 2x a day   losartan 50 MG tablet Commonly known as: COZAAR losartan 50 mg tablet  TAKE 1 TABLET BY MOUTH ONCE DAILY IN THE MORNING   metroNIDAZOLE 500 MG tablet Commonly known as: FLAGYL metronidazole 500 mg tablet  TAKE 1 TABLET BY MOUTH EVERY 8 HOURS FOR 10 DAYS   rosuvastatin 20 MG tablet Commonly known as: CRESTOR rosuvastatin 20 mg tablet  TAKE 1 TABLET BY MOUTH ONCE DAILY IN THE EVENING   triamcinolone cream 0.1 % Commonly known as: KENALOG triamcinolone acetonide 0.1 % topical cream  APPLY 1 APPLICATION TOPICALLY TWICE DAILY to eczema on your left elbow   Trulicity 3 MG/0.5ML Sopn Generic drug: Dulaglutide Inject 3 mg into the skin once a week.         ALLERGIES: Allergies  Allergen Reactions   Milk-Related Compounds     itching     REVIEW OF SYSTEMS: A comprehensive ROS was conducted with the patient and is negative except as per HPI     OBJECTIVE:   VITAL SIGNS: There were no vitals taken for this visit.   PHYSICAL EXAM:  General: Pt appears well and is in NAD  Neck: General: Supple without adenopathy or carotid bruits. Thyroid: Thyroid size normal.  No goiter or nodules appreciated.  Lungs: Clear with good BS bilat with no rales, rhonchi, or wheezes  Heart: RRR   Abdomen: Normoactive bowel sounds, soft, nontender, without masses or organomegaly palpable  Extremities:   Lower extremities - No pretibial edema. No lesions.  Neuro: MS is good with appropriate affect, pt is alert and Ox3   DM Foot Exam per podiatry 03/28/2022    DATA REVIEWED:  Lab Results  Component Value Date   HGBA1C 13.1 (A) 12/18/2021   HGBA1C 11.8 (A) 04/04/2021    Results for Albert [name] (MRN [id number]) as of 04/05/2021 12:49  Ref. Range 04/04/2021 10:31  Sodium Latest Ref Range: 135 - 145 mEq/L 138  Potassium Latest Ref Range: 3.5 - 5.1 mEq/L 4.5  Chloride Latest Ref Range: 96 - 112 mEq/L 102  CO2 Latest Ref Range: 19 - 32  mEq/L 31  Glucose Latest Ref Range: 70 - 99 mg/dL 248 (H)  BUN Latest Ref Range: 6 - 23 mg/dL 13  Creatinine Latest Ref Range: 0.40 - 1.50 mg/dL 1.85  Calcium Latest Ref Range: 8.4 - 10.5 mg/dL 9.5  GFR Latest Ref Range: >60.00 mL/min 109.56  Total CHOL/HDL Ratio Unknown 3  Cholesterol Latest Ref Range: 0 - 200 mg/dL 909  HDL Cholesterol Latest Ref Range: >39.00 mg/dL 31.12  LDL (calc) Latest Ref Range: 0 - 99 mg/dL 162 (H)  MICROALB/CREAT RATIO Latest Ref Range: 0.0 - 30.0 mg/g 86.8 (H)  NonHDL Unknown 133.66  Triglycerides Latest Ref Range: 0.0 - 149.0 mg/dL 44.6  VLDL Latest Ref Range: 0.0 - 40.0 mg/dL 9.6     In office BG 950 mg/dL  ASSESSMENT / PLAN / RECOMMENDATIONS:   1) Type 2 Diabetes Mellitus, Poorly controlled, With Neuropathic  complications and microalbuminuria- Most recent A1c of 13.1 %. Goal A1c < 7.0 %.    He again  did not bring his Dexcom information today Poorly controlled diabetes due to medication non-adherence   Pt took insulin this AM but did not eat breakfast, discussed importance of taking this insulin before his first and last meal of the day  We also discussed the importance of having glucose data and to bring his CGM in the future  Intolerant to  metformin We discussed risk of microvascular complications with blindness, ESRD and increased risk of amputation with persistent hyperglycemia  I am not changing his  dose of insulin, the issue is not the dose but the fact that he does not take it   MEDICATIONS: Continue Humalog mix  65 units before breakfast and 65 units before supper Continue Farxiga 10 mg 1 tablet daily Continue Trulicity 3 mg weekly  EDUCATION / INSTRUCTIONS: BG monitoring instructions: Patient is instructed to check his blood sugars 3 times a day, before meals. Call [facility] if: BG persistently < 70  I reviewed the Rule of 15 for the treatment of hypoglycemia in detail with the patient. Literature supplied.   2) Diabetic complications:  Eye: Does not have known diabetic retinopathy.  Neuro/ Feet: Does  have known diabetic peripheral neuropathy. Renal: Patient does not have known baseline CKD. He is  on an ACEI/ARB at present.  3) Dyslipidemia:    -LDL slightly elevated at 124 mg/DL -No changes at this time we will continue to monitor  Medication Continue atorvastatin 10 mg daily   4) microalbuminuria:  -His MA/CR ratio is elevated as above -Patient already on losartan -We will encourage glucose control   Signed electronically by: Lyndle Herrlich, MD  [facility]  [facility] [address] [place] [place], [place] [address] Phone: [phone] FAX: [phone]   CC: Albert Forest, MD [address] [place] [address] Phone: [phone]  Fax: [phone]    Return to Endocrinology clinic as below: Future Appointments  Date Time Provider Department Center  05/09/2022  7:30 AM Dontavius Keim, Konrad Dolores, MD [id number] None

## 2023-06-04 ENCOUNTER — Encounter: Payer: Self-pay | Admitting: Podiatry

## 2023-06-04 ENCOUNTER — Ambulatory Visit: Payer: MEDICAID | Admitting: Podiatry

## 2023-06-04 DIAGNOSIS — Z794 Long term (current) use of insulin: Secondary | ICD-10-CM

## 2023-06-04 DIAGNOSIS — M79675 Pain in left toe(s): Secondary | ICD-10-CM

## 2023-06-04 DIAGNOSIS — M79674 Pain in right toe(s): Secondary | ICD-10-CM | POA: Diagnosis not present

## 2023-06-04 DIAGNOSIS — E119 Type 2 diabetes mellitus without complications: Secondary | ICD-10-CM

## 2023-06-04 DIAGNOSIS — B351 Tinea unguium: Secondary | ICD-10-CM | POA: Diagnosis not present

## 2023-06-04 DIAGNOSIS — Q828 Other specified congenital malformations of skin: Secondary | ICD-10-CM | POA: Diagnosis not present

## 2023-06-04 NOTE — Progress Notes (Signed)
Complaint:  Visit Type: Patient returns to my office for continued preventative foot care services. Complaint: Patient states" my nails have grown long and thick and become painful to walk and wear shoes" Patient also has callus  right  feet.  Patient has been diagnosed with DM with no foot complications. The patient presents for preventative foot care services.  Podiatric Exam: Vascular: dorsalis pedis and posterior tibial pulses are palpable bilateral. Capillary return is immediate. Temperature gradient is WNL. Skin turgor WNL  Sensorium: Normal Semmes Weinstein monofilament test. Normal tactile sensation bilaterally. Nail Exam: Pt has thick disfigured discolored nails with subungual debris noted bilateral entire nail hallux through fifth toenails Ulcer Exam: There is no evidence of ulcer or pre-ulcerative changes or infection. Orthopedic Exam: Muscle tone and strength are WNL. No limitations in general ROM. No crepitus or effusions noted. Foot type and digits show no abnormalities. Bony prominences are unremarkable. Skin: Porokeratosis medial arch right foot. And sub 2 left.   No infection or ulcers  Diagnosis:  Onychomycosis, , Pain in right toe, pain in left toes  Porokeratosis  Treatment & Plan Procedures and Treatment: Consent by patient was obtained for treatment procedures.   Debridement of mycotic and hypertrophic toenails, 1 through 5 bilateral and clearing of subungual debris. Debride callus/porokeratosis  with # 15 blade.  No ulceration, no infection noted.  Return Visit-Office Procedure: Patient instructed to return to the office for a follow up visit 6 months for continued evaluation and treatment.    Helane Gunther DPM

## 2023-09-20 ENCOUNTER — Other Ambulatory Visit: Payer: Self-pay | Admitting: Internal Medicine

## 2023-12-02 ENCOUNTER — Ambulatory Visit (INDEPENDENT_AMBULATORY_CARE_PROVIDER_SITE_OTHER): Payer: MEDICAID | Admitting: Podiatry

## 2023-12-02 DIAGNOSIS — E119 Type 2 diabetes mellitus without complications: Secondary | ICD-10-CM

## 2023-12-02 DIAGNOSIS — M79674 Pain in right toe(s): Secondary | ICD-10-CM

## 2023-12-02 DIAGNOSIS — M79675 Pain in left toe(s): Secondary | ICD-10-CM

## 2023-12-02 DIAGNOSIS — Q828 Other specified congenital malformations of skin: Secondary | ICD-10-CM

## 2023-12-02 DIAGNOSIS — B351 Tinea unguium: Secondary | ICD-10-CM

## 2023-12-02 DIAGNOSIS — Z794 Long term (current) use of insulin: Secondary | ICD-10-CM

## 2023-12-02 NOTE — Progress Notes (Signed)
 Complaint:  Visit Type: Patient returns to my office for continued preventative foot care services. Complaint: Patient states" my nails have grown long and thick and become painful to walk and wear shoes" Patient also has callus  right  feet.  Patient has been diagnosed with DM with no foot complications. The patient presents for preventative foot care services.  Podiatric Exam: Vascular: dorsalis pedis and posterior tibial pulses are palpable bilateral. Capillary return is immediate. Temperature gradient is WNL. Skin turgor WNL  Sensorium: Normal Semmes Weinstein monofilament test. Normal tactile sensation bilaterally. Nail Exam: Pt has thick disfigured discolored nails with subungual debris noted bilateral entire nail hallux through fifth toenails Ulcer Exam: There is no evidence of ulcer or pre-ulcerative changes or infection. Orthopedic Exam: Muscle tone and strength are WNL. No limitations in general ROM. No crepitus or effusions noted. Foot type and digits show no abnormalities. Bony prominences are unremarkable. Skin: Porokeratosis medial arch right foot. And sub 2 left.   No infection or ulcers  Diagnosis:  Onychomycosis, , Pain in right toe, pain in left toes  Porokeratosis  Treatment & Plan Procedures and Treatment: Consent by patient was obtained for treatment procedures.   Debridement of mycotic and hypertrophic toenails, 1 through 5 bilateral and clearing of subungual debris. Debride callus/porokeratosis  with # 15 blade.  No ulceration, no infection noted.  Return Visit-Office Procedure: Patient instructed to return to the office for a follow up visit 6 months for continued evaluation and treatment.    Helane Gunther DPM

## 2023-12-03 ENCOUNTER — Ambulatory Visit: Payer: MEDICAID | Admitting: Podiatry

## 2024-01-05 NOTE — Progress Notes (Signed)
 [facility] - Clinic Note  01/13/2024   CHIEF COMPLAINT Patient presents for Retina Evaluation  HISTORY OF PRESENT ILLNESS: Albert [name] is a 38 y.o. male who presents to the clinic today for:  HPI     Retina Evaluation   In both eyes.  Associated Symptoms Negative for Flashes, Floaters, Distortion, Blind Spot, Pain, Redness, Photophobia, Glare, Trauma, Scalp Tenderness, Jaw Claudication, Shoulder/Hip pain, Fever, Weight Loss and Fatigue.  Context:  distance vision.  Treatments tried include no treatments.        Comments   Pt states vision is poor but he has glasses on order from Dr. Octavia. Pt denies FOL/floaters/pain. Pt does not use ats. Last BS 200 more than 1 month ago, pt waiting dexcon. Pt has been diabetic since 6th grade. A1c=13.0 1 month ago, usually 12.0        Last edited by Elnor Avelina RAMAN, COT on 01/13/2024  9:45 AM.     Pt states his A1C was 13 three weeks ago.   Referring physician: Octavia Bruckner, MD [address] [place],  [place] [address]  HISTORICAL INFORMATION:  Selected notes from the MEDICAL RECORD NUMBER Referred by Dr. JAMA:  Ocular Hx- PMH-   CURRENT MEDICATIONS: No current outpatient medications on file. (Ophthalmic Drugs)   No current facility-administered medications for this visit. (Ophthalmic Drugs)   Current Outpatient Medications (Other)  Medication Sig   amLODipine (NORVASC) 10 MG tablet Take 10 mg by mouth daily.   atorvastatin  (LIPITOR) 10 MG tablet Take 1 tablet (10 mg total) by mouth daily.   cholecalciferol (VITAMIN D3) 25 MCG (1000 UT) tablet Take 1,000 Units by mouth daily.   ciprofloxacin (CIPRO) 500 MG tablet  (Patient not taking: Reported on 01/13/2024)   Continuous Blood Gluc Receiver (DEXCOM G6 RECEIVER) DEVI Dexcom G6 Receiver   dapagliflozin  propanediol (FARXIGA ) 10 MG TABS tablet Take 1 tablet (10 mg total) by mouth daily.   Dulaglutide  (TRULICITY ) 3 MG/0.5ML SOPN Inject 3 mg into the  skin once a week.   fluticasone (FLONASE) 50 MCG/ACT nasal spray fluticasone propionate 50 mcg/actuation nasal spray,suspension  USE 1 SPRAY(S) IN EACH NOSTRIL ONCE DAILY   folic acid (FOLVITE) 1 MG tablet folic acid 1 mg tablet  TAKE 1 TABLET BY MOUTH ONCE DAILY IN THE MORNING   gabapentin (NEURONTIN) 300 MG capsule gabapentin 300 mg capsule  Take 1 capsule every day by oral route at bedtime for 30 days.   haloperidol (HALDOL) 5 MG tablet haloperidol 5 mg tablet  Take 1 tablet every day by oral route at bedtime.   haloperidol decanoate (HALDOL DECANOATE) 50 MG/ML injection Inject into the muscle.   Insulin  Lispro Prot & Lispro (HUMALOG  MIX 75/25 KWIKPEN) (75-25) 100 UNIT/ML Kwikpen Inject 65 Units into the skin 2 (two) times daily before a meal. To replace novolin mix and Glimepiride   Insulin  Pen Needle 31G X 5 MM MISC 1 Device by Does not apply route in the morning and at bedtime.   ketoconazole (NIZORAL) 2 % cream ketoconazole 2 % topical cream  APPLY TO THE AFFECTED AREA(S) BY TOPICAL ROUTE - between your toes 2x a day   losartan (COZAAR) 50 MG tablet losartan 50 mg tablet  TAKE 1 TABLET BY MOUTH ONCE DAILY IN THE MORNING   metroNIDAZOLE (FLAGYL) 500 MG tablet    rosuvastatin (CRESTOR) 20 MG tablet rosuvastatin 20 mg tablet  TAKE 1 TABLET BY MOUTH ONCE DAILY IN THE EVENING (Patient not taking: Reported  on 01/13/2024)   triamcinolone  cream (KENALOG ) 0.1 % triamcinolone  acetonide 0.1 % topical cream  APPLY 1 APPLICATION TOPICALLY TWICE DAILY to eczema on your left elbow   No current facility-administered medications for this visit. (Other)   REVIEW OF SYSTEMS: ROS   Positive for: Endocrine, Eyes Negative for: Constitutional, Gastrointestinal, Neurological, Skin, Genitourinary, Musculoskeletal, HENT, Cardiovascular, Respiratory, Psychiatric, Allergic/Imm, Heme/Lymph Last edited by Elnor Avelina RAMAN, COT on 01/13/2024  9:44 AM.     ALLERGIES Allergies  Allergen Reactions   Milk-Related  Compounds     itching   PAST MEDICAL HISTORY Past Medical History:  Diagnosis Date   History of asthma    per pt as child   Phimosis    Type 2 diabetes mellitus treated with insulin  (HCC)    followed by pcp---  per pt checks sugar daily AM,  fasting average-- 190   Past Surgical History:  Procedure Laterality Date   NO PAST SURGERIES     FAMILY HISTORY History reviewed. No pertinent family history. SOCIAL HISTORY Social History   Tobacco Use   Smoking status: Every Day    Current packs/day: 0.50    Average packs/day: 0.5 packs/day for 8.0 years (4.0 ttl pk-yrs)    Types: Cigarettes   Smokeless tobacco: Never  Vaping Use   Vaping status: Former   Quit date: 05/17/2018  Substance Use Topics   Alcohol use: Yes    Comment: occasional beer   Drug use: No       OPHTHALMIC EXAM:  Base Eye Exam     Visual Acuity (Snellen - Linear)       Right Left   Dist [place] 20/50 -2 20/200 +1   Dist ph [place] 20/30 +2 20/40 +2         Tonometry (Tonopen, 9:40 AM)       Right Left   Pressure 23 21         Pupils       Pupils Dark Light Shape React APD   Right PERRL 3 2 Round Brisk None   Left PERRL 3 2 Round Brisk None         Visual Fields       Left Right    Full Full         Extraocular Movement       Right Left    Full, Ortho Full, Ortho         Neuro/Psych     Oriented x3: Yes         Dilation     Both eyes: 1.0% Mydriacyl, 2.5% Phenylephrine @ 9:41 AM           Slit Lamp and Fundus Exam     Slit Lamp Exam       Right Left   Lids/Lashes Normal    Conjunctiva/Sclera White and quiet    Cornea Clear    Anterior Chamber Deep and quiet    Iris Round and reactive    Lens Clear    Anterior Vitreous Normal            IMAGING AND PROCEDURES  Imaging and Procedures for 01/13/2024  OCT, Retina - OU - Both Eyes       *Images captured and stored on drive  Diagnosis / Impression:    Clinical management:  See  below  Abbreviations: NFP - Normal foveal profile. CME - cystoid macular edema. PED - pigment epithelial detachment. IRF - intraretinal fluid. SRF - subretinal fluid. EZ - ellipsoid  zone. ERM - epiretinal membrane. ORA - outer retinal atrophy. ORT - outer retinal tubulation. SRHM - subretinal hyper-reflective material. IRHM - intraretinal hyper-reflective material      Fluorescein  Angiography Optos (Transit OS)       **Images stored on drive**  Impression: OD:  OS:             ASSESSMENT/PLAN:   ICD-10-CM   1. Retinal edema  H35.81 OCT, Retina - OU - Both Eyes    Fluorescein  Angiography Optos (Transit OS)     Proliferative diabetic retinopathy w/o DME, OU  - A1C 20 December 2023 - The incidence, risk factors for progression, natural history and treatment options for diabetic retinopathy were discussed with patient.   - The need for close monitoring of blood glucose, blood pressure, and serum lipids, avoiding cigarette or any type of tobacco, and the need for long term follow up was also discussed with patient. - FA today (Trans OS) PDR OU, Scattered patches of vascular non profusion greatest inferior quadrant OU; Focal leaking NV inferior midzone OU - Pt would benefit from PRP OU, likely after second round of IVA OU.  - OCT shows OD: Mild diffuse retinal thinning; OS: Focal cystic changes in IRHM temporal mac. Mild diffuse retinal thinning.  - discussed findings and prognosis - recommend IVA OU #1, OS first today (08.06.25). - pt wishes to proceed with injection - RBA of procedure discussed, questions answered - Avastin  informed consent obtained and signed 08.06.25 - see procedure note - f/u 4 wk f/u, DFE, OCT.   2,3. Hypertensive retinopathy OU - discussed importance of tight BP control - monitor      Ophthalmic Meds Ordered this visit:  No orders of the defined types were placed in this encounter.    No follow-ups on file.  There are no Patient Instructions on file  for this visit.  Explained the diagnoses, plan, and follow up with the patient and they expressed understanding.  Patient expressed understanding of the importance of proper follow up care.   [place] JUDITHANN Hans, M.D., Ph.D. Diseases & Surgery of the Retina and Vitreous [facility] 01/13/2024  Abbreviations: M myopia (nearsighted); A astigmatism; H hyperopia (farsighted); P presbyopia; Mrx spectacle prescription;  CTL contact lenses; OD right eye; OS left eye; OU both eyes  XT exotropia; ET esotropia; PEK punctate epithelial keratitis; PEE punctate epithelial erosions; DES dry eye syndrome; MGD meibomian gland dysfunction; ATs artificial tears; PFAT's preservative free artificial tears; NSC nuclear sclerotic cataract; PSC posterior subcapsular cataract; ERM epi-retinal membrane; PVD posterior vitreous detachment; RD retinal detachment; DM diabetes mellitus; DR diabetic retinopathy; NPDR non-proliferative diabetic retinopathy; PDR proliferative diabetic retinopathy; CSME clinically significant macular edema; DME diabetic macular edema; dbh dot blot hemorrhages; CWS cotton wool spot; POAG primary open angle glaucoma; C/D cup-to-disc ratio; HVF humphrey visual field; GVF goldmann visual field; OCT optical coherence tomography; IOP intraocular pressure; BRVO Branch retinal vein occlusion; CRVO central retinal vein occlusion; CRAO central retinal artery occlusion; BRAO branch retinal artery occlusion; RT retinal tear; SB scleral buckle; PPV pars plana vitrectomy; VH Vitreous hemorrhage; PRP panretinal laser photocoagulation; IVK intravitreal kenalog ; VMT vitreomacular traction; MH Macular hole;  NVD neovascularization of the disc; NVE neovascularization elsewhere; AREDS age related eye disease study; ARMD age related macular degeneration; POAG primary open angle glaucoma; EBMD epithelial/anterior basement membrane dystrophy; ACIOL anterior chamber intraocular lens; IOL intraocular lens; PCIOL  posterior chamber intraocular lens; Phaco/IOL phacoemulsification with intraocular lens placement; PRK photorefractive keratectomy; LASIK laser  assisted in situ keratomileusis; HTN hypertension; DM diabetes mellitus; COPD chronic obstructive pulmonary disease

## 2024-01-13 ENCOUNTER — Encounter (INDEPENDENT_AMBULATORY_CARE_PROVIDER_SITE_OTHER): Payer: Self-pay | Admitting: Ophthalmology

## 2024-01-13 ENCOUNTER — Ambulatory Visit (INDEPENDENT_AMBULATORY_CARE_PROVIDER_SITE_OTHER): Payer: MEDICAID | Admitting: Ophthalmology

## 2024-01-13 VITALS — BP 143/86 | HR 93

## 2024-01-13 DIAGNOSIS — H35033 Hypertensive retinopathy, bilateral: Secondary | ICD-10-CM

## 2024-01-13 DIAGNOSIS — I10 Essential (primary) hypertension: Secondary | ICD-10-CM | POA: Diagnosis not present

## 2024-01-13 DIAGNOSIS — Z794 Long term (current) use of insulin: Secondary | ICD-10-CM

## 2024-01-13 DIAGNOSIS — E113513 Type 2 diabetes mellitus with proliferative diabetic retinopathy with macular edema, bilateral: Secondary | ICD-10-CM

## 2024-01-13 DIAGNOSIS — Z7985 Long-term (current) use of injectable non-insulin antidiabetic drugs: Secondary | ICD-10-CM

## 2024-01-13 DIAGNOSIS — H3581 Retinal edema: Secondary | ICD-10-CM

## 2024-01-13 MED ORDER — BEVACIZUMAB CHEMO INJECTION 1.25MG/0.05ML SYRINGE FOR KALEIDOSCOPE
1.2500 mg | INTRAVITREAL | Status: AC | PRN
  Administered 2024-01-13: 1.25 mg via INTRAVITREAL

## 2024-02-02 NOTE — Progress Notes (Signed)
 [facility] - Clinic Note  02/10/2024   CHIEF COMPLAINT Patient presents for Retina Follow Up  HISTORY OF PRESENT ILLNESS: Albert [name] is a 38 y.o. male who presents to the clinic today for:  HPI     Retina Follow Up   Patient presents with  Diabetic Retinopathy.  In both eyes.  This started 4 weeks ago.  Duration of 4 weeks.  Since onset it is stable.  I, the attending physician,  performed the HPI with the patient and updated documentation appropriately.        Comments   4 week retina follow up PDR and IVA OU pt is reporting no no vision changes noticed he denies any flashes or floaters his last reading 357 this am       Last edited by Valdemar Rogue, MD on 02/10/2024 12:33 PM.     Pt states he did not have any issues after the first round of injections.   Referring physician: Octavia Bruckner, MD [address] [place],  [place] [address]  HISTORICAL INFORMATION:  Selected notes from the MEDICAL RECORD NUMBER Referred by Dr. Medford Octavia for retina eval LEE:  Ocular Hx- PMH-   CURRENT MEDICATIONS: No current outpatient medications on file. (Ophthalmic Drugs)   No current facility-administered medications for this visit. (Ophthalmic Drugs)   Current Outpatient Medications (Other)  Medication Sig   amLODipine (NORVASC) 10 MG tablet Take 10 mg by mouth daily.   atorvastatin  (LIPITOR) 10 MG tablet Take 1 tablet (10 mg total) by mouth daily.   cholecalciferol (VITAMIN D3) 25 MCG (1000 UT) tablet Take 1,000 Units by mouth daily.   ciprofloxacin (CIPRO) 500 MG tablet    Continuous Blood Gluc Receiver (DEXCOM G6 RECEIVER) DEVI Dexcom G6 Receiver   dapagliflozin  propanediol (FARXIGA ) 10 MG TABS tablet Take 1 tablet (10 mg total) by mouth daily.   Dulaglutide  (TRULICITY ) 3 MG/0.5ML SOPN Inject 3 mg into the skin once a week.   fluticasone (FLONASE) 50 MCG/ACT nasal spray fluticasone propionate 50 mcg/actuation nasal spray,suspension  USE 1  SPRAY(S) IN EACH NOSTRIL ONCE DAILY   folic acid (FOLVITE) 1 MG tablet folic acid 1 mg tablet  TAKE 1 TABLET BY MOUTH ONCE DAILY IN THE MORNING   gabapentin (NEURONTIN) 300 MG capsule gabapentin 300 mg capsule  Take 1 capsule every day by oral route at bedtime for 30 days.   haloperidol (HALDOL) 5 MG tablet haloperidol 5 mg tablet  Take 1 tablet every day by oral route at bedtime.   haloperidol decanoate (HALDOL DECANOATE) 50 MG/ML injection Inject into the muscle.   Insulin  Lispro Prot & Lispro (HUMALOG  MIX 75/25 KWIKPEN) (75-25) 100 UNIT/ML Kwikpen Inject 65 Units into the skin 2 (two) times daily before a meal. To replace novolin mix and Glimepiride   Insulin  Pen Needle 31G X 5 MM MISC 1 Device by Does not apply route in the morning and at bedtime.   ketoconazole (NIZORAL) 2 % cream ketoconazole 2 % topical cream  APPLY TO THE AFFECTED AREA(S) BY TOPICAL ROUTE - between your toes 2x a day   losartan (COZAAR) 50 MG tablet losartan 50 mg tablet  TAKE 1 TABLET BY MOUTH ONCE DAILY IN THE MORNING   metroNIDAZOLE (FLAGYL) 500 MG tablet    rosuvastatin (CRESTOR) 20 MG tablet rosuvastatin 20 mg tablet  TAKE 1 TABLET BY MOUTH ONCE DAILY IN THE EVENING   triamcinolone  cream (KENALOG ) 0.1 % triamcinolone  acetonide 0.1 % topical cream  APPLY  1 APPLICATION TOPICALLY TWICE DAILY to eczema on your left elbow   No current facility-administered medications for this visit. (Other)   REVIEW OF SYSTEMS: ROS   Positive for: Endocrine, Eyes Negative for: Constitutional, Gastrointestinal, Neurological, Skin, Genitourinary, Musculoskeletal, HENT, Cardiovascular, Respiratory, Psychiatric, Allergic/Imm, Heme/Lymph Last edited by Resa Delon ORN, COT on 02/10/2024  7:52 AM.     ALLERGIES Allergies  Allergen Reactions   Milk-Related Compounds     itching   PAST MEDICAL HISTORY Past Medical History:  Diagnosis Date   History of asthma    per pt as child   Phimosis    Type 2 diabetes mellitus  treated with insulin  (HCC)    followed by pcp---  per pt checks sugar daily AM,  fasting average-- 190   Past Surgical History:  Procedure Laterality Date   NO PAST SURGERIES     FAMILY HISTORY History reviewed. No pertinent family history. SOCIAL HISTORY Social History   Tobacco Use   Smoking status: Every Day    Current packs/day: 0.50    Average packs/day: 0.5 packs/day for 8.0 years (4.0 ttl pk-yrs)    Types: Cigarettes   Smokeless tobacco: Never  Vaping Use   Vaping status: Former   Quit date: 05/17/2018  Substance Use Topics   Alcohol use: Yes    Comment: occasional beer   Drug use: No       OPHTHALMIC EXAM:  Base Eye Exam     Visual Acuity (Snellen - Linear)       Right Left   Dist [place] 20/60 -2 20/150 -2   Dist ph [place] 20/40 20/30         Tonometry (Tonopen, 7:58 AM)       Right Left   Pressure 22 20  Squeezing         Pupils       Pupils Dark Light Shape React APD   Right PERRL 3 2 Round Brisk None   Left PERRL 3 2 Round Brisk None         Visual Fields       Left Right    Full Full         Extraocular Movement       Right Left    Full, Ortho Full, Ortho         Neuro/Psych     Oriented x3: Yes   Mood/Affect: Normal         Dilation     Both eyes: 2.5% Phenylephrine @ 7:58 AM           Slit Lamp and Fundus Exam     Slit Lamp Exam       Right Left   Lids/Lashes Normal Normal   Conjunctiva/Sclera melanosis melanosis   Cornea Clear tear film debris   Anterior Chamber Deep and clear, narrow temporal angle Deep and clear, narrow temporal angle   Iris Round and Dilated, no NVI Round and Dilated   Lens trace cortical changes trace cortical changes, trace PSC, +vacuoles   Anterior Vitreous mild syneresis mild syneresis         Fundus Exam       Right Left   Disc Pink and Sharp mild pallor, sharp rim, mild PPP   C/D Ratio 0.6 0.5   Macula Flat, Good foveal reflex, scattered MA/DBH greatest temporal mac Flat,  Good foveal reflex, scattered MA/DBH greatest temporally, +cystic changes   Vessels Attenuated, Tortuous, early NV inferior midzone Attenuated, Tortuous, copper wiring, +NV inferior  midzone   Periphery Attached, scattered MA/DBH Attached, scattered MA/DBH           IMAGING AND PROCEDURES  Imaging and Procedures for 02/10/2024  OCT, Retina - OU - Both Eyes       Right Eye Quality was good. Central Foveal Thickness: 228. Progression has been stable. Findings include normal foveal contour, no IRF, no SRF (Mild diffuse retinal thinning).   Left Eye Quality was good. Central Foveal Thickness: 224. Progression has been stable. Findings include normal foveal contour, no SRF, intraretinal hyper-reflective material, intraretinal fluid (Focal cystic changes and IRHM temporal mac. Mild diffuse retinal thinning. ).   Notes *Images captured and stored on drive  Diagnosis / Impression:  Mild diffuse retinal thinning OU OS: Focal cystic changes and IRHM temporal mac.   Clinical management:  See below  Abbreviations: NFP - Normal foveal profile. CME - cystoid macular edema. PED - pigment epithelial detachment. IRF - intraretinal fluid. SRF - subretinal fluid. EZ - ellipsoid zone. ERM - epiretinal membrane. ORA - outer retinal atrophy. ORT - outer retinal tubulation. SRHM - subretinal hyper-reflective material. IRHM - intraretinal hyper-reflective material      Intravitreal Injection, Pharmacologic Agent - OD - Right Eye       Time Out 02/10/2024. 8:09 AM. Confirmed correct patient, procedure, site, and patient consented.   Anesthesia Topical anesthesia was used. Anesthetic medications included Lidocaine  2%, Proparacaine 0.5%.   Procedure Preparation included 5% betadine to ocular surface, eyelid speculum. A supplied needle was used.   Injection: 1.25 mg Bevacizumab  1.25mg /0.26ml   Route: Intravitreal, Site: Right Eye   NDC: 49757-939-98, Lot: [id number], Expiration date: 02/28/2024    Post-op Post injection exam found visual acuity of at least counting fingers. The patient tolerated the procedure well. There were no complications. The patient received written and verbal post procedure care education. Post injection medications were not given.      Intravitreal Injection, Pharmacologic Agent - OS - Left Eye       Time Out 02/10/2024. 8:09 AM. Confirmed correct patient, procedure, site, and patient consented.   Anesthesia Topical anesthesia was used. Anesthetic medications included Lidocaine  2%, Proparacaine 0.5%.   Procedure Preparation included 5% betadine to ocular surface, eyelid speculum. A supplied needle was used.   Injection: 1.25 mg Bevacizumab  1.25mg /0.44ml   Route: Intravitreal, Site: Left Eye   NDC: [id number], Lot: [id number], Expiration date: 05/05/2024   Post-op Post injection exam found visual acuity of at least counting fingers. The patient tolerated the procedure well. There were no complications. The patient received written and verbal post procedure care education. Post injection medications were not given.           ASSESSMENT/PLAN:   ICD-10-CM   1. Proliferative diabetic retinopathy of both eyes with macular edema associated with type 2 diabetes mellitus (HCC)  E11.3513 OCT, Retina - OU - Both Eyes    Intravitreal Injection, Pharmacologic Agent - OD - Right Eye    Intravitreal Injection, Pharmacologic Agent - OS - Left Eye    Bevacizumab  (AVASTIN ) SOLN 1.25 mg    Bevacizumab  (AVASTIN ) SOLN 1.25 mg    2. Current use of insulin  (HCC)  Z79.4     3. Diabetes mellitus treated with injections of non-insulin  medication (HCC)  E11.9    Z79.85     4. Essential hypertension  I10     5. Hypertensive retinopathy of both eyes  H35.033       1-3. Proliferative diabetic retinopathy w/o DME, OU  - A1C  20 December 2023 per pt report - s/p IVA OU #1 (08.6.25) - FA (08.06.25) shows PDR OU, Scattered patches of vascular non profusion greatest  inferior quadrant OU; Focal leaking NV inferior midzone OU - Pt would benefit from PRP OU, likely after second round of IVA OU.  - OCT shows Mild diffuse retinal thinning OU; OS: Focal cystic changes in IRHM temporal mac at 4 weeks. - BCVA OD 20/40 from 20/30; OS 20/30 from 20/40 - recommend IVA OU #2, today 09.03.25 - pt wishes to proceed with injections - RBA of procedure discussed, questions answered - Avastin  informed consent obtained and signed 08.06.25 - see procedure note - f/u 09.16.25 for possible PRP OD @ 9:45a, 4 wk f/u, DFE, OCT, possible injxn  4,5. Hypertensive retinopathy OU - discussed importance of tight BP control - monitor   Ophthalmic Meds Ordered this visit:  Meds ordered this encounter  Medications   Bevacizumab  (AVASTIN ) SOLN 1.25 mg   Bevacizumab  (AVASTIN ) SOLN 1.25 mg     Return in about 4 weeks (around 03/09/2024) for f/u 09.16.25 for possible PRP OD @ 9:45a  and then f/u PDR OU , DFE, OCT, Possible, IVA, OU.  There are no Patient Instructions on file for this visit.  Explained the diagnoses, plan, and follow up with the patient and they expressed understanding.  Patient expressed understanding of the importance of proper follow up care.    This document serves as a record of services personally performed by Redell JUDITHANN Hans, MD, PhD. It was created on their behalf by Almetta Pesa, an ophthalmic technician. The creation of this record is the provider's dictation and/or activities during the visit.    Electronically signed by: Almetta Pesa, OA, 02/14/24  8:39 PM  This document serves as a record of services personally performed by Redell JUDITHANN Hans, MD, PhD. It was created on their behalf by Wanda GEANNIE Keens, COT an ophthalmic technician. The creation of this record is the provider's dictation and/or activities during the visit.    Electronically signed by:  Wanda GEANNIE Keens, COT  02/14/24 8:39 PM  Redell JUDITHANN Hans, M.D., Ph.D. Diseases & Surgery  of the Retina and Vitreous [facility] 02/10/2024  I have reviewed the above documentation for accuracy and completeness, and I agree with the above. Redell JUDITHANN Hans, M.D., Ph.D. 02/14/24 8:42 PM    Abbreviations: M myopia (nearsighted); A astigmatism; H hyperopia (farsighted); P presbyopia; Mrx spectacle prescription;  CTL contact lenses; OD right eye; OS left eye; OU both eyes  XT exotropia; ET esotropia; PEK punctate epithelial keratitis; PEE punctate epithelial erosions; DES dry eye syndrome; MGD meibomian gland dysfunction; ATs artificial tears; PFAT's preservative free artificial tears; NSC nuclear sclerotic cataract; PSC posterior subcapsular cataract; ERM epi-retinal membrane; PVD posterior vitreous detachment; RD retinal detachment; DM diabetes mellitus; DR diabetic retinopathy; NPDR non-proliferative diabetic retinopathy; PDR proliferative diabetic retinopathy; CSME clinically significant macular edema; DME diabetic macular edema; dbh dot blot hemorrhages; CWS cotton wool spot; POAG primary open angle glaucoma; C/D cup-to-disc ratio; HVF humphrey visual field; GVF goldmann visual field; OCT optical coherence tomography; IOP intraocular pressure; BRVO Branch retinal vein occlusion; CRVO central retinal vein occlusion; CRAO central retinal artery occlusion; BRAO branch retinal artery occlusion; RT retinal tear; SB scleral buckle; PPV pars plana vitrectomy; VH Vitreous hemorrhage; PRP panretinal laser photocoagulation; IVK intravitreal kenalog ; VMT vitreomacular traction; MH Macular hole;  NVD neovascularization of the disc; NVE neovascularization elsewhere; AREDS age related eye disease study; ARMD age related  macular degeneration; POAG primary open angle glaucoma; EBMD epithelial/anterior basement membrane dystrophy; ACIOL anterior chamber intraocular lens; IOL intraocular lens; PCIOL posterior chamber intraocular lens; Phaco/IOL phacoemulsification with intraocular lens  placement; PRK photorefractive keratectomy; LASIK laser assisted in situ keratomileusis; HTN hypertension; DM diabetes mellitus; COPD chronic obstructive pulmonary disease

## 2024-02-10 ENCOUNTER — Encounter (INDEPENDENT_AMBULATORY_CARE_PROVIDER_SITE_OTHER): Payer: Self-pay | Admitting: Ophthalmology

## 2024-02-10 ENCOUNTER — Ambulatory Visit (INDEPENDENT_AMBULATORY_CARE_PROVIDER_SITE_OTHER): Payer: MEDICAID | Admitting: Ophthalmology

## 2024-02-10 DIAGNOSIS — Z794 Long term (current) use of insulin: Secondary | ICD-10-CM | POA: Diagnosis not present

## 2024-02-10 DIAGNOSIS — I10 Essential (primary) hypertension: Secondary | ICD-10-CM

## 2024-02-10 DIAGNOSIS — E113513 Type 2 diabetes mellitus with proliferative diabetic retinopathy with macular edema, bilateral: Secondary | ICD-10-CM

## 2024-02-10 DIAGNOSIS — Z7985 Long-term (current) use of injectable non-insulin antidiabetic drugs: Secondary | ICD-10-CM

## 2024-02-10 DIAGNOSIS — H35033 Hypertensive retinopathy, bilateral: Secondary | ICD-10-CM

## 2024-02-10 MED ORDER — BEVACIZUMAB CHEMO INJECTION 1.25MG/0.05ML SYRINGE FOR KALEIDOSCOPE
1.2500 mg | INTRAVITREAL | Status: AC | PRN
Start: 2024-02-10 — End: 2024-02-10
  Administered 2024-02-10: 1.25 mg via INTRAVITREAL

## 2024-02-10 MED ORDER — BEVACIZUMAB CHEMO INJECTION 1.25MG/0.05ML SYRINGE FOR KALEIDOSCOPE
1.2500 mg | INTRAVITREAL | Status: AC | PRN
  Administered 2024-02-10: 1.25 mg via INTRAVITREAL

## 2024-02-18 NOTE — Progress Notes (Signed)
 [facility] - Clinic Note  02/23/2024   CHIEF COMPLAINT Patient presents for Retina Follow Up  HISTORY OF PRESENT ILLNESS: Albert [name] is a 38 y.o. male who presents to the clinic today for:  HPI     Retina Follow Up   Patient presents with  Diabetic Retinopathy.  In both eyes.  This started 2 weeks ago.  Duration of 2 months.  Since onset it is stable.  I, the attending physician,  performed the HPI with the patient and updated documentation appropriately.        Comments   Pt states his new glasses have been helping his vision, he has been wearing them about 2 weeks. Pt denies FOL/floaters/pain. Pt is hoping he will not need any treatment today.       Last edited by Valdemar Rogue, MD on 03/01/2024  5:18 PM.      Referring physician: Octavia Bruckner, MD [address] [place],  [place] [address]  HISTORICAL INFORMATION:  Selected notes from the MEDICAL RECORD NUMBER Referred by Dr. Medford Octavia for retina eval LEE:  Ocular Hx- PMH-   CURRENT MEDICATIONS: Current Outpatient Medications (Ophthalmic Drugs)  Medication Sig   prednisoLONE  acetate (PRED FORTE ) 1 % ophthalmic suspension Place 1 drop into the right eye 4 (four) times daily.   No current facility-administered medications for this visit. (Ophthalmic Drugs)   Current Outpatient Medications (Other)  Medication Sig   amLODipine (NORVASC) 10 MG tablet Take 10 mg by mouth daily.   atorvastatin  (LIPITOR) 10 MG tablet Take 1 tablet (10 mg total) by mouth daily.   cholecalciferol (VITAMIN D3) 25 MCG (1000 UT) tablet Take 1,000 Units by mouth daily.   ciprofloxacin (CIPRO) 500 MG tablet    Continuous Blood Gluc Receiver (DEXCOM G6 RECEIVER) DEVI Dexcom G6 Receiver   dapagliflozin  propanediol (FARXIGA ) 10 MG TABS tablet Take 1 tablet (10 mg total) by mouth daily.   Dulaglutide  (TRULICITY ) 3 MG/0.5ML SOPN Inject 3 mg into the skin once a week.   fluticasone (FLONASE) 50 MCG/ACT nasal  spray fluticasone propionate 50 mcg/actuation nasal spray,suspension  USE 1 SPRAY(S) IN EACH NOSTRIL ONCE DAILY   folic acid (FOLVITE) 1 MG tablet folic acid 1 mg tablet  TAKE 1 TABLET BY MOUTH ONCE DAILY IN THE MORNING   gabapentin (NEURONTIN) 300 MG capsule gabapentin 300 mg capsule  Take 1 capsule every day by oral route at bedtime for 30 days.   haloperidol (HALDOL) 5 MG tablet haloperidol 5 mg tablet  Take 1 tablet every day by oral route at bedtime.   haloperidol decanoate (HALDOL DECANOATE) 50 MG/ML injection Inject into the muscle.   Insulin  Lispro Prot & Lispro (HUMALOG  MIX 75/25 KWIKPEN) (75-25) 100 UNIT/ML Kwikpen Inject 65 Units into the skin 2 (two) times daily before a meal. To replace novolin mix and Glimepiride   Insulin  Pen Needle 31G X 5 MM MISC 1 Device by Does not apply route in the morning and at bedtime.   ketoconazole (NIZORAL) 2 % cream ketoconazole 2 % topical cream  APPLY TO THE AFFECTED AREA(S) BY TOPICAL ROUTE - between your toes 2x a day   losartan (COZAAR) 50 MG tablet losartan 50 mg tablet  TAKE 1 TABLET BY MOUTH ONCE DAILY IN THE MORNING   metroNIDAZOLE (FLAGYL) 500 MG tablet    rosuvastatin (CRESTOR) 20 MG tablet rosuvastatin 20 mg tablet  TAKE 1 TABLET BY MOUTH ONCE DAILY IN THE EVENING   triamcinolone  cream (KENALOG ) 0.1 %  triamcinolone  acetonide 0.1 % topical cream  APPLY 1 APPLICATION TOPICALLY TWICE DAILY to eczema on your left elbow   No current facility-administered medications for this visit. (Other)   REVIEW OF SYSTEMS: ROS   Positive for: Endocrine, Eyes Negative for: Constitutional, Gastrointestinal, Neurological, Skin, Genitourinary, Musculoskeletal, HENT, Cardiovascular, Respiratory, Psychiatric, Allergic/Imm, Heme/Lymph Last edited by Elnor Avelina RAMAN, COT on 02/23/2024 10:08 AM.      ALLERGIES Allergies  Allergen Reactions   Milk-Related Compounds     itching   PAST MEDICAL HISTORY Past Medical History:  Diagnosis Date   History  of asthma    per pt as child   Phimosis    Type 2 diabetes mellitus treated with insulin  (HCC)    followed by pcp---  per pt checks sugar daily AM,  fasting average-- 190   Past Surgical History:  Procedure Laterality Date   NO PAST SURGERIES     FAMILY HISTORY History reviewed. No pertinent family history. SOCIAL HISTORY Social History   Tobacco Use   Smoking status: Every Day    Current packs/day: 0.50    Average packs/day: 0.5 packs/day for 8.0 years (4.0 ttl pk-yrs)    Types: Cigarettes   Smokeless tobacco: Never  Vaping Use   Vaping status: Former   Quit date: 05/17/2018  Substance Use Topics   Alcohol use: Yes    Comment: occasional beer   Drug use: No       OPHTHALMIC EXAM:  Base Eye Exam     Visual Acuity (Snellen - Linear)       Right Left   Dist cc 20/30 -2 20/30 -1   Dist ph cc 20/25 -2 20/25 -2    Correction: Glasses         Tonometry (Tonopen, 10:02 AM)       Right Left   Pressure 23 24  Squeezing/holding lids        Pupils       Pupils Dark Light Shape React APD   Right PERRL 4 3 Round Brisk None   Left PERRL 4 3 Round Brisk None         Visual Fields       Left Right    Full Full         Extraocular Movement       Right Left    Full, Ortho Full, Ortho         Neuro/Psych     Oriented x3: Yes   Mood/Affect: Normal         Dilation     Both eyes: 1.0% Mydriacyl, 2.5% Phenylephrine @ 10:04 AM           Slit Lamp and Fundus Exam     Slit Lamp Exam       Right Left   Lids/Lashes Normal Normal   Conjunctiva/Sclera melanosis melanosis   Cornea Clear tear film debris   Anterior Chamber Deep and clear, narrow temporal angle Deep and clear, narrow temporal angle   Iris Round and Dilated, no NVI Round and Dilated   Lens trace cortical changes trace cortical changes, trace PSC, +vacuoles   Anterior Vitreous mild syneresis mild syneresis         Fundus Exam       Right Left   Disc Pink and Sharp mild  pallor, sharp rim, mild PPP   C/D Ratio 0.6 0.5   Macula Flat, Good foveal reflex, scattered MA/DBH greatest temporal mac Flat, Good foveal reflex, scattered MA/DBH greatest temporally, +  cystic changes   Vessels Attenuated, Tortuous, early NV inferior midzone Attenuated, Tortuous, copper wiring, +NV inferior midzone   Periphery Attached, scattered MA/DBH Attached, scattered MA/DBH           Refraction     Wearing Rx       Sphere Cylinder Axis   Right -3.50 +0.75 073   Left -2.75 +0.75 043    Age: 74 weeks   Type: SVL           IMAGING AND PROCEDURES  Imaging and Procedures for 02/23/2024  OCT, Retina - OU - Both Eyes       Right Eye Quality was good. Central Foveal Thickness: 221. Progression has been stable. Findings include normal foveal contour, no IRF, no SRF (Mild diffuse retinal thinning).   Left Eye Quality was good. Central Foveal Thickness: 214. Progression has been stable. Findings include normal foveal contour, no SRF, intraretinal hyper-reflective material, intraretinal fluid (Focal cystic changes and IRHM temporal mac. Mild diffuse retinal thinning. ).   Notes *Images captured and stored on drive  Diagnosis / Impression:  Mild diffuse retinal thinning OU OS: Focal cystic changes and IRHM temporal mac.   Clinical management:  See below  Abbreviations: NFP - Normal foveal profile. CME - cystoid macular edema. PED - pigment epithelial detachment. IRF - intraretinal fluid. SRF - subretinal fluid. EZ - ellipsoid zone. ERM - epiretinal membrane. ORA - outer retinal atrophy. ORT - outer retinal tubulation. SRHM - subretinal hyper-reflective material. IRHM - intraretinal hyper-reflective material      Panretinal Photocoagulation - OD - Right Eye       LASER PROCEDURE NOTE  Diagnosis:   Proliferative Diabetic Retinopathy, RIGHT EYE  Procedure:  Pan-retinal photocoagulation using slit lamp laser, RIGHT EYE  Anesthesia:  Topical  Surgeon: Redell Hans,  MD, PhD   Informed consent obtained, operative eye marked, and time out performed prior to initiation of laser.   Lumenis [id number] slit lamp laser Pattern: 3x3 square Power: 240 mW Duration: 20 msec  Spot size: 500 microns  # spots: 1700 spots  Complications: None.  RTC: Oct 1 or later - DFE/OCT, possible injxn  Patient tolerated the procedure well and received written and verbal post-procedure care information/education.           ASSESSMENT/PLAN:   ICD-10-CM   1. Proliferative diabetic retinopathy of both eyes with macular edema associated with type 2 diabetes mellitus (HCC)  E11.3513 OCT, Retina - OU - Both Eyes    Panretinal Photocoagulation - OD - Right Eye    2. Current use of insulin  (HCC)  Z79.4     3. Diabetes mellitus treated with injections of non-insulin  medication (HCC)  E11.9    Z79.85     4. Essential hypertension  I10     5. 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 Proliferative diabetic retinopathy w/o DME, OU  - A1C 20 December 2023 per pt report - s/p IVA OU #1 (08.6.25), #2 (09.03.25) - FA (08.06.25) shows PDR OU, Scattered patches of vascular non profusion greatest inferior quadrant OU; Focal leaking NV inferior midzone OU - Pt would benefit from PRP OU, likely after second round of IVA OU.  - OCT shows Mild diffuse retinal thinning OU; OS: Focal cystic changes in IRHM temporal mac  - BCVA OD 20/25 from 20/40; OS 20/25 from 20/30 - recommend PRP OD, today 09.16.25 - pt wishes to  proceed with laser - RBA of procedure discussed, questions answered - see procedure note - Begin prednisolone  QID OD x 7days - Avastin  informed consent obtained and signed 08.06.25 - f/u week of 10.01.25 for DFE, OCT, possible injxns  4,5. Hypertensive retinopathy OU - discussed importance of tight BP control - monitor   6. Ocular HTN OU  - IOP 23,24  - Begin brimonidine BID OU  Ophthalmic Meds  Ordered this visit:  Meds ordered this encounter  Medications   prednisoLONE  acetate (PRED FORTE ) 1 % ophthalmic suspension    Sig: Place 1 drop into the right eye 4 (four) times daily.    Dispense:  10 mL    Refill:  0     Return for on or after 10.1.25 for PDR OU, DFE, OCT, likely inj OU.  There are no Patient Instructions on file for this visit.  Explained the diagnoses, plan, and follow up with the patient and they expressed understanding.  Patient expressed understanding of the importance of proper follow up care.    This document serves as a record of services personally performed by Redell JUDITHANN Hans, MD, PhD. It was created on their behalf by Wanda GEANNIE Keens, COT an ophthalmic technician. The creation of this record is the provider's dictation and/or activities during the visit.    Electronically signed by:  Wanda GEANNIE Keens, COT  03/01/24 5:18 PM  Redell JUDITHANN Hans, M.D., Ph.D. Diseases & Surgery of the Retina and Vitreous [facility] 02/23/2024  I have reviewed the above documentation for accuracy and completeness, and I agree with the above. Redell JUDITHANN Hans, M.D., Ph.D. 03/01/24 5:21 PM    Abbreviations: M myopia (nearsighted); A astigmatism; H hyperopia (farsighted); P presbyopia; Mrx spectacle prescription;  CTL contact lenses; OD right eye; OS left eye; OU both eyes  XT exotropia; ET esotropia; PEK punctate epithelial keratitis; PEE punctate epithelial erosions; DES dry eye syndrome; MGD meibomian gland dysfunction; ATs artificial tears; PFAT's preservative free artificial tears; NSC nuclear sclerotic cataract; PSC posterior subcapsular cataract; ERM epi-retinal membrane; PVD posterior vitreous detachment; RD retinal detachment; DM diabetes mellitus; DR diabetic retinopathy; NPDR non-proliferative diabetic retinopathy; PDR proliferative diabetic retinopathy; CSME clinically significant macular edema; DME diabetic macular edema; dbh dot blot  hemorrhages; CWS cotton wool spot; POAG primary open angle glaucoma; C/D cup-to-disc ratio; HVF humphrey visual field; GVF goldmann visual field; OCT optical coherence tomography; IOP intraocular pressure; BRVO Branch retinal vein occlusion; CRVO central retinal vein occlusion; CRAO central retinal artery occlusion; BRAO branch retinal artery occlusion; RT retinal tear; SB scleral buckle; PPV pars plana vitrectomy; VH Vitreous hemorrhage; PRP panretinal laser photocoagulation; IVK intravitreal kenalog ; VMT vitreomacular traction; MH Macular hole;  NVD neovascularization of the disc; NVE neovascularization elsewhere; AREDS age related eye disease study; ARMD age related macular degeneration; POAG primary open angle glaucoma; EBMD epithelial/anterior basement membrane dystrophy; ACIOL anterior chamber intraocular lens; IOL intraocular lens; PCIOL posterior chamber intraocular lens; Phaco/IOL phacoemulsification with intraocular lens placement; PRK photorefractive keratectomy; LASIK laser assisted in situ keratomileusis; HTN hypertension; DM diabetes mellitus; COPD chronic obstructive pulmonary disease

## 2024-02-23 ENCOUNTER — Ambulatory Visit (INDEPENDENT_AMBULATORY_CARE_PROVIDER_SITE_OTHER): Payer: MEDICAID | Admitting: Ophthalmology

## 2024-02-23 ENCOUNTER — Encounter (INDEPENDENT_AMBULATORY_CARE_PROVIDER_SITE_OTHER): Payer: Self-pay | Admitting: Ophthalmology

## 2024-02-23 DIAGNOSIS — E113513 Type 2 diabetes mellitus with proliferative diabetic retinopathy with macular edema, bilateral: Secondary | ICD-10-CM

## 2024-02-23 DIAGNOSIS — Z794 Long term (current) use of insulin: Secondary | ICD-10-CM

## 2024-02-23 DIAGNOSIS — Z7985 Long-term (current) use of injectable non-insulin antidiabetic drugs: Secondary | ICD-10-CM

## 2024-02-23 DIAGNOSIS — I10 Essential (primary) hypertension: Secondary | ICD-10-CM

## 2024-02-23 DIAGNOSIS — H40053 Ocular hypertension, bilateral: Secondary | ICD-10-CM

## 2024-02-23 DIAGNOSIS — H35033 Hypertensive retinopathy, bilateral: Secondary | ICD-10-CM

## 2024-02-23 MED ORDER — PREDNISOLONE ACETATE 1 % OP SUSP: 1.0000 [drp] | Freq: Four times a day (QID) | OPHTHALMIC | 0 refills | Status: DC

## 2024-02-29 NOTE — Progress Notes (Signed)
 [facility] - Clinic Note  03/09/2024   CHIEF COMPLAINT Patient presents for Retina Follow Up  HISTORY OF PRESENT ILLNESS: Albert [name] is a 38 y.o. male who presents to the clinic today for:  HPI     Retina Follow Up   Patient presents with  Diabetic Retinopathy.  In both eyes.  Duration of 2 weeks.  Since onset it is stable.  I, the attending physician,  performed the HPI with the patient and updated documentation appropriately.        Comments   Patient states vision the same OU. Using brimonidine bid OU      Last edited by Valdemar Rogue, MD on 03/09/2024 12:44 PM.    Pt states he had no issues after the laser on the right eye.   Referring physician: Octavia Bruckner, MD [address] [place],  [place] [address]  HISTORICAL INFORMATION:  Selected notes from the MEDICAL RECORD NUMBER Referred by Dr. Medford Octavia for retina eval LEE:  Ocular Hx- PMH-   CURRENT MEDICATIONS: Current Outpatient Medications (Ophthalmic Drugs)  Medication Sig   brimonidine (ALPHAGAN P) 0.1 % SOLN Place 1 drop into both eyes 2 (two) times daily.   dorzolamide-timolol (COSOPT) 2-0.5 % ophthalmic solution Place 1 drop into both eyes 2 (two) times daily.   prednisoLONE  acetate (PRED FORTE ) 1 % ophthalmic suspension Place 1 drop into the right eye 4 (four) times daily.   No current facility-administered medications for this visit. (Ophthalmic Drugs)   Current Outpatient Medications (Other)  Medication Sig   amLODipine (NORVASC) 10 MG tablet Take 10 mg by mouth daily.   atorvastatin  (LIPITOR) 10 MG tablet Take 1 tablet (10 mg total) by mouth daily.   cholecalciferol (VITAMIN D3) 25 MCG (1000 UT) tablet Take 1,000 Units by mouth daily.   ciprofloxacin (CIPRO) 500 MG tablet    Continuous Blood Gluc Receiver (DEXCOM G6 RECEIVER) DEVI Dexcom G6 Receiver   dapagliflozin  propanediol (FARXIGA ) 10 MG TABS tablet Take 1 tablet (10 mg total) by mouth daily.   Dulaglutide   (TRULICITY ) 3 MG/0.5ML SOPN Inject 3 mg into the skin once a week.   fluticasone (FLONASE) 50 MCG/ACT nasal spray fluticasone propionate 50 mcg/actuation nasal spray,suspension  USE 1 SPRAY(S) IN EACH NOSTRIL ONCE DAILY   folic acid (FOLVITE) 1 MG tablet folic acid 1 mg tablet  TAKE 1 TABLET BY MOUTH ONCE DAILY IN THE MORNING   gabapentin (NEURONTIN) 300 MG capsule gabapentin 300 mg capsule  Take 1 capsule every day by oral route at bedtime for 30 days.   haloperidol (HALDOL) 5 MG tablet haloperidol 5 mg tablet  Take 1 tablet every day by oral route at bedtime.   haloperidol decanoate (HALDOL DECANOATE) 50 MG/ML injection Inject into the muscle.   Insulin  Lispro Prot & Lispro (HUMALOG  MIX 75/25 KWIKPEN) (75-25) 100 UNIT/ML Kwikpen Inject 65 Units into the skin 2 (two) times daily before a meal. To replace novolin mix and Glimepiride   Insulin  Pen Needle 31G X 5 MM MISC 1 Device by Does not apply route in the morning and at bedtime.   ketoconazole (NIZORAL) 2 % cream ketoconazole 2 % topical cream  APPLY TO THE AFFECTED AREA(S) BY TOPICAL ROUTE - between your toes 2x a day   losartan (COZAAR) 50 MG tablet losartan 50 mg tablet  TAKE 1 TABLET BY MOUTH ONCE DAILY IN THE MORNING   metroNIDAZOLE (FLAGYL) 500 MG tablet    rosuvastatin (CRESTOR) 20 MG tablet rosuvastatin  20 mg tablet  TAKE 1 TABLET BY MOUTH ONCE DAILY IN THE EVENING   triamcinolone  cream (KENALOG ) 0.1 % triamcinolone  acetonide 0.1 % topical cream  APPLY 1 APPLICATION TOPICALLY TWICE DAILY to eczema on your left elbow   No current facility-administered medications for this visit. (Other)   REVIEW OF SYSTEMS: ROS   Positive for: Endocrine, Eyes Negative for: Constitutional, Gastrointestinal, Neurological, Skin, Genitourinary, Musculoskeletal, HENT, Cardiovascular, Respiratory, Psychiatric, Allergic/Imm, Heme/Lymph Last edited by Verneda Auston BIRCH, COT on 03/09/2024  8:06 AM.       ALLERGIES Allergies  Allergen Reactions    Milk-Related Compounds     itching   PAST MEDICAL HISTORY Past Medical History:  Diagnosis Date   History of asthma    per pt as child   Phimosis    Type 2 diabetes mellitus treated with insulin  (HCC)    followed by pcp---  per pt checks sugar daily AM,  fasting average-- 190   Past Surgical History:  Procedure Laterality Date   NO PAST SURGERIES     FAMILY HISTORY History reviewed. No pertinent family history. SOCIAL HISTORY Social History   Tobacco Use   Smoking status: Every Day    Current packs/day: 0.50    Average packs/day: 0.5 packs/day for 8.0 years (4.0 ttl pk-yrs)    Types: Cigarettes   Smokeless tobacco: Never  Vaping Use   Vaping status: Former   Quit date: 05/17/2018  Substance Use Topics   Alcohol use: Yes    Comment: occasional beer   Drug use: No       OPHTHALMIC EXAM:  Base Eye Exam     Visual Acuity (Snellen - Linear)       Right Left   Dist cc 20/25 -2 20/40 +1   Dist ph cc NI 20/30 +1    Correction: Glasses         Tonometry (Tonopen, 8:09 AM)       Right Left   Pressure 21 24         Pupils       Dark Light Shape React APD   Right 4 3 Round Brisk None   Left 4 3 Round Brisk None         Visual Fields (Counting fingers)       Left Right    Full Full         Extraocular Movement       Right Left    Full, Ortho Full, Ortho         Neuro/Psych     Oriented x3: Yes   Mood/Affect: Normal         Dilation     Both eyes: 1.0% Mydriacyl, 2.5% Phenylephrine @ 8:09 AM           Slit Lamp and Fundus Exam     Slit Lamp Exam       Right Left   Lids/Lashes Normal Normal   Conjunctiva/Sclera melanosis melanosis   Cornea Clear tear film debris   Anterior Chamber Deep and clear, narrow temporal angle Deep and clear, narrow temporal angle   Iris Round and Dilated, no NVI Round and Dilated   Lens trace cortical changes trace cortical changes, trace PSC, +vacuoles   Anterior Vitreous mild syneresis mild  syneresis         Fundus Exam       Right Left   Disc Pink and Sharp mild pallor, sharp rim, mild PPP   C/D Ratio 0.6 0.5  Macula Flat, Good foveal reflex, scattered MA/DBH greatest temporal mac Flat, Good foveal reflex, scattered MA/DBH greatest temporally, +cystic changes   Vessels Attenuated, Tortuous, early NV inferior midzone- regressing Attenuated, Tortuous, copper wiring, +NV inferior midzone- regressing   Periphery Attached, scattered MA/DBH, good early PRP laser changes 360 Attached, scattered MA/DBH           Refraction     Wearing Rx       Sphere Cylinder Axis   Right -3.50 +0.75 073   Left -2.75 +0.75 043    Type: SVL           IMAGING AND PROCEDURES  Imaging and Procedures for 03/09/2024  OCT, Retina - OU - Both Eyes       Right Eye Quality was good. Central Foveal Thickness: 231. Progression has been stable. Findings include normal foveal contour, no IRF, no SRF (Mild diffuse retinal thinning).   Left Eye Quality was good. Central Foveal Thickness: 213. Progression has been stable. Findings include normal foveal contour, no SRF, intraretinal hyper-reflective material, intraretinal fluid (Focal cystic changes and IRHM temporal mac. Mild diffuse retinal thinning. ).   Notes *Images captured and stored on drive  Diagnosis / Impression:  Mild diffuse retinal thinning OU OS: Focal cystic changes and IRHM temporal mac  Clinical management:  See below  Abbreviations: NFP - Normal foveal profile. CME - cystoid macular edema. PED - pigment epithelial detachment. IRF - intraretinal fluid. SRF - subretinal fluid. EZ - ellipsoid zone. ERM - epiretinal membrane. ORA - outer retinal atrophy. ORT - outer retinal tubulation. SRHM - subretinal hyper-reflective material. IRHM - intraretinal hyper-reflective material      Intravitreal Injection, Pharmacologic Agent - OD - Right Eye       Time Out 03/09/2024. 8:37 AM. Confirmed correct patient, procedure,  site, and patient consented.   Anesthesia Topical anesthesia was used. Anesthetic medications included Lidocaine  2%, Proparacaine 0.5%.   Procedure Preparation included 5% betadine to ocular surface, eyelid speculum. A supplied needle was used.   Injection: 1.25 mg Bevacizumab  1.25mg /0.68ml   Route: Intravitreal, Site: Right Eye   NDC: H525437, Lot: [id number], Expiration date: 04/03/2024   Post-op Post injection exam found visual acuity of at least counting fingers. The patient tolerated the procedure well. There were no complications. The patient received written and verbal post procedure care education. Post injection medications were not given.      Intravitreal Injection, Pharmacologic Agent - OS - Left Eye       Time Out 03/09/2024. 8:39 AM. Confirmed correct patient, procedure, site, and patient consented.   Anesthesia Topical anesthesia was used. Anesthetic medications included Lidocaine  2%, Proparacaine 0.5%.   Procedure Preparation included 5% betadine to ocular surface, eyelid speculum. A supplied needle was used.   Injection: 1.25 mg Bevacizumab  1.25mg /0.76ml   Route: Intravitreal, Site: Left Eye   NDC: H525437, Lot: [id number], Expiration date: 06/18/2024   Post-op Post injection exam found visual acuity of at least counting fingers. The patient tolerated the procedure well. There were no complications. The patient received written and verbal post procedure care education. Post injection medications were not given.            ASSESSMENT/PLAN:   ICD-10-CM   1. Proliferative diabetic retinopathy of both eyes with macular edema associated with type 2 diabetes mellitus (HCC)  E11.3513 OCT, Retina - OU - Both Eyes    Intravitreal Injection, Pharmacologic Agent - OD - Right Eye    Intravitreal Injection, Pharmacologic Agent - OS -  Left Eye    Bevacizumab  (AVASTIN ) SOLN 1.25 mg    Bevacizumab  (AVASTIN ) SOLN 1.25 mg    2. Current use of insulin  (HCC)  Z79.4      3. Diabetes mellitus treated with injections of non-insulin  medication (HCC)  E11.9    Z79.85     4. Essential hypertension  I10     5. 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 Proliferative diabetic retinopathy w/o DME, OU  - A1C 20 December 2023 per pt report - s/p IVA OU #1 (08.6.25), #2 (09.03.25) - s/p PRP OD (09.16.25) - FA (08.06.25) shows PDR OU, Scattered patches of vascular non profusion greatest inferior quadrant OU; Focal leaking NV inferior midzone OU - Pt would benefit from PRP OU completed PRP OD - OCT shows Mild diffuse retinal thinning OU; OS: Focal cystic changes in IRHM temporal mac at 4 weeks. - BCVA OD 20/25 - stable; OS 20/30 from 20/25 - recommend IVA OU #3 today, 10.01.25 with f/u in 4 weeks - pt wishes to proceed w/ injections - RBA of procedure discussed, questions answered - Avastin  informed consent obtained and signed 08.06.25 - see procedure note - f/u 4 weeks for DFE, OCT, possible injections - f/u 10.13.25 DFE, OCT, possible PRP OS  4,5. Hypertensive retinopathy OU - discussed importance of tight BP control - monitor   6. Ocular HTN OU  - IOP 21, 24 - Continue Brimonidine BID OU, add Cosopt OU BID (rx sent to pharmacy on file)  Ophthalmic Meds Ordered this visit:  Meds ordered this encounter  Medications   dorzolamide-timolol (COSOPT) 2-0.5 % ophthalmic solution    Sig: Place 1 drop into both eyes 2 (two) times daily.    Dispense:  10 mL    Refill:  3   Bevacizumab  (AVASTIN ) SOLN 1.25 mg   Bevacizumab  (AVASTIN ) SOLN 1.25 mg     Return in about 4 weeks (around 04/06/2024) for f/u, PDR, DFE, OCT, Possible, IVA, OU, 10.13.25 @ 9:45 possible PRP OS.  There are no Patient Instructions on file for this visit.  Explained the diagnoses, plan, and follow up with the patient and they expressed understanding.  Patient expressed understanding of the importance of proper follow up care.    This document serves as a record of services personally performed by Redell JUDITHANN Hans, MD, PhD. It was created on their behalf by Almetta Pesa, an ophthalmic technician. The creation of this record is the provider's dictation and/or activities during the visit.    Electronically signed by: Almetta Pesa, OA, 03/09/24  12:46 PM  This document serves as a record of services personally performed by Redell JUDITHANN Hans, MD, PhD. It was created on their behalf by Wanda GEANNIE Keens, COT an ophthalmic technician. The creation of this record is the provider's dictation and/or activities during the visit.    Electronically signed by:  Wanda GEANNIE Keens, COT  03/09/24 12:46 PM  Redell JUDITHANN Hans, M.D., Ph.D. Diseases & Surgery of the Retina and Vitreous [facility] 03/09/2024  I have reviewed the above documentation for accuracy and completeness, and I agree with the above. Redell JUDITHANN Hans, M.D., Ph.D. 03/09/24 12:47 PM    Abbreviations: M myopia (nearsighted); A astigmatism; H hyperopia (farsighted); P presbyopia; Mrx spectacle prescription;  CTL contact lenses; OD right eye; OS left eye; OU both eyes  XT exotropia; ET esotropia; PEK punctate epithelial keratitis; PEE  punctate epithelial erosions; DES dry eye syndrome; MGD meibomian gland dysfunction; ATs artificial tears; PFAT's preservative free artificial tears; NSC nuclear sclerotic cataract; PSC posterior subcapsular cataract; ERM epi-retinal membrane; PVD posterior vitreous detachment; RD retinal detachment; DM diabetes mellitus; DR diabetic retinopathy; NPDR non-proliferative diabetic retinopathy; PDR proliferative diabetic retinopathy; CSME clinically significant macular edema; DME diabetic macular edema; dbh dot blot hemorrhages; CWS cotton wool spot; POAG primary open angle glaucoma; C/D cup-to-disc ratio; HVF humphrey visual field; GVF goldmann visual field; OCT optical coherence tomography; IOP intraocular pressure;  BRVO Branch retinal vein occlusion; CRVO central retinal vein occlusion; CRAO central retinal artery occlusion; BRAO branch retinal artery occlusion; RT retinal tear; SB scleral buckle; PPV pars plana vitrectomy; VH Vitreous hemorrhage; PRP panretinal laser photocoagulation; IVK intravitreal kenalog ; VMT vitreomacular traction; MH Macular hole;  NVD neovascularization of the disc; NVE neovascularization elsewhere; AREDS age related eye disease study; ARMD age related macular degeneration; POAG primary open angle glaucoma; EBMD epithelial/anterior basement membrane dystrophy; ACIOL anterior chamber intraocular lens; IOL intraocular lens; PCIOL posterior chamber intraocular lens; Phaco/IOL phacoemulsification with intraocular lens placement; PRK photorefractive keratectomy; LASIK laser assisted in situ keratomileusis; HTN hypertension; DM diabetes mellitus; COPD chronic obstructive pulmonary disease

## 2024-03-09 ENCOUNTER — Ambulatory Visit (INDEPENDENT_AMBULATORY_CARE_PROVIDER_SITE_OTHER): Payer: MEDICAID | Admitting: Ophthalmology

## 2024-03-09 ENCOUNTER — Encounter (INDEPENDENT_AMBULATORY_CARE_PROVIDER_SITE_OTHER): Payer: Self-pay | Admitting: Ophthalmology

## 2024-03-09 DIAGNOSIS — H40053 Ocular hypertension, bilateral: Secondary | ICD-10-CM

## 2024-03-09 DIAGNOSIS — E119 Type 2 diabetes mellitus without complications: Secondary | ICD-10-CM

## 2024-03-09 DIAGNOSIS — Z7985 Long-term (current) use of injectable non-insulin antidiabetic drugs: Secondary | ICD-10-CM

## 2024-03-09 DIAGNOSIS — Z794 Long term (current) use of insulin: Secondary | ICD-10-CM | POA: Diagnosis not present

## 2024-03-09 DIAGNOSIS — I10 Essential (primary) hypertension: Secondary | ICD-10-CM

## 2024-03-09 DIAGNOSIS — H35033 Hypertensive retinopathy, bilateral: Secondary | ICD-10-CM

## 2024-03-09 DIAGNOSIS — E113513 Type 2 diabetes mellitus with proliferative diabetic retinopathy with macular edema, bilateral: Secondary | ICD-10-CM

## 2024-03-09 MED ORDER — DORZOLAMIDE HCL-TIMOLOL MAL 2-0.5 % OP SOLN: 1.0000 [drp] | Freq: Two times a day (BID) | OPHTHALMIC | 3 refills | Status: DC

## 2024-03-09 MED ORDER — BEVACIZUMAB CHEMO INJECTION 1.25MG/0.05ML SYRINGE FOR KALEIDOSCOPE
1.2500 mg | INTRAVITREAL | Status: AC | PRN
  Administered 2024-03-09: 1.25 mg via INTRAVITREAL

## 2024-03-10 NOTE — Progress Notes (Signed)
 [facility] - Clinic Note  03/21/2024   CHIEF COMPLAINT Patient presents for Retina Follow Up  HISTORY OF PRESENT ILLNESS: Albert [name] is a 38 y.o. male who presents to the clinic today for:  HPI     Retina Follow Up   Patient presents with  Diabetic Retinopathy.  In both eyes.  This started 2 months ago.  Duration of 2 weeks.  Since onset it is stable.  I, the attending physician,  performed the HPI with the patient and updated documentation appropriately.        Comments   Pt forgot to bring PALs. Patient states vision the same OU. Pt states he is consistent with  brimonidine bid OU.      Last edited by Valdemar Rogue, MD on 03/21/2024 12:57 PM.     Pt states   Referring physician: Octavia Bruckner, MD [address] [place],  [place] [address]  HISTORICAL INFORMATION:  Selected notes from the MEDICAL RECORD NUMBER Referred by Dr. Medford Octavia for retina eval LEE:  Ocular Hx- PMH-   CURRENT MEDICATIONS: Current Outpatient Medications (Ophthalmic Drugs)  Medication Sig   brimonidine (ALPHAGAN P) 0.1 % SOLN Place 1 drop into both eyes 2 (two) times daily.   dorzolamide-timolol (COSOPT) 2-0.5 % ophthalmic solution Place 1 drop into both eyes 2 (two) times daily.   prednisoLONE  acetate (PRED FORTE ) 1 % ophthalmic suspension Place 1 drop into the right eye 4 (four) times daily.   No current facility-administered medications for this visit. (Ophthalmic Drugs)   Current Outpatient Medications (Other)  Medication Sig   amLODipine (NORVASC) 10 MG tablet Take 10 mg by mouth daily.   atorvastatin  (LIPITOR) 10 MG tablet Take 1 tablet (10 mg total) by mouth daily.   cholecalciferol (VITAMIN D3) 25 MCG (1000 UT) tablet Take 1,000 Units by mouth daily.   ciprofloxacin (CIPRO) 500 MG tablet    Continuous Blood Gluc Receiver (DEXCOM G6 RECEIVER) DEVI Dexcom G6 Receiver   dapagliflozin  propanediol (FARXIGA ) 10 MG TABS tablet Take 1 tablet (10 mg  total) by mouth daily.   Dulaglutide  (TRULICITY ) 3 MG/0.5ML SOPN Inject 3 mg into the skin once a week.   fluticasone (FLONASE) 50 MCG/ACT nasal spray fluticasone propionate 50 mcg/actuation nasal spray,suspension  USE 1 SPRAY(S) IN EACH NOSTRIL ONCE DAILY   folic acid (FOLVITE) 1 MG tablet folic acid 1 mg tablet  TAKE 1 TABLET BY MOUTH ONCE DAILY IN THE MORNING   gabapentin (NEURONTIN) 300 MG capsule gabapentin 300 mg capsule  Take 1 capsule every day by oral route at bedtime for 30 days.   haloperidol (HALDOL) 5 MG tablet haloperidol 5 mg tablet  Take 1 tablet every day by oral route at bedtime.   haloperidol decanoate (HALDOL DECANOATE) 50 MG/ML injection Inject into the muscle.   Insulin  Lispro Prot & Lispro (HUMALOG  MIX 75/25 KWIKPEN) (75-25) 100 UNIT/ML Kwikpen Inject 65 Units into the skin 2 (two) times daily before a meal. To replace novolin mix and Glimepiride   Insulin  Pen Needle 31G X 5 MM MISC 1 Device by Does not apply route in the morning and at bedtime.   ketoconazole (NIZORAL) 2 % cream ketoconazole 2 % topical cream  APPLY TO THE AFFECTED AREA(S) BY TOPICAL ROUTE - between your toes 2x a day   losartan (COZAAR) 50 MG tablet losartan 50 mg tablet  TAKE 1 TABLET BY MOUTH ONCE DAILY IN THE MORNING   metroNIDAZOLE (FLAGYL) 500 MG tablet  rosuvastatin (CRESTOR) 20 MG tablet rosuvastatin 20 mg tablet  TAKE 1 TABLET BY MOUTH ONCE DAILY IN THE EVENING   triamcinolone  cream (KENALOG ) 0.1 % triamcinolone  acetonide 0.1 % topical cream  APPLY 1 APPLICATION TOPICALLY TWICE DAILY to eczema on your left elbow   No current facility-administered medications for this visit. (Other)   REVIEW OF SYSTEMS: ROS   Positive for: Endocrine, Eyes Negative for: Constitutional, Gastrointestinal, Neurological, Skin, Genitourinary, Musculoskeletal, HENT, Cardiovascular, Respiratory, Psychiatric, Allergic/Imm, Heme/Lymph Last edited by Elnor Avelina RAMAN, COT on 03/21/2024 10:13 AM.         ALLERGIES Allergies  Allergen Reactions   Milk-Related Compounds     itching   PAST MEDICAL HISTORY Past Medical History:  Diagnosis Date   History of asthma    per pt as child   Phimosis    Type 2 diabetes mellitus treated with insulin  (HCC)    followed by pcp---  per pt checks sugar daily AM,  fasting average-- 190   Past Surgical History:  Procedure Laterality Date   NO PAST SURGERIES     FAMILY HISTORY History reviewed. No pertinent family history. SOCIAL HISTORY Social History   Tobacco Use   Smoking status: Every Day    Current packs/day: 0.50    Average packs/day: 0.5 packs/day for 8.0 years (4.0 ttl pk-yrs)    Types: Cigarettes   Smokeless tobacco: Never  Vaping Use   Vaping status: Former   Quit date: 05/17/2018  Substance Use Topics   Alcohol use: Yes    Comment: occasional beer   Drug use: No       OPHTHALMIC EXAM:  Base Eye Exam     Visual Acuity (Snellen - Linear)       Right Left   Dist [place] 20/80 +1 20/80 -2   Dist ph [place] 20/30 +2 20/30 -2         Tonometry (Tonopen, 10:09 AM)       Right Left   Pressure 25 21  Holding lids        Pupils       Pupils Dark Light Shape React APD   Right PERRL 4 3 Round Minimal None   Left PERRL 4 3 Round Minimal None         Visual Fields       Left Right    Full Full         Extraocular Movement       Right Left    Full, Ortho Full, Ortho         Neuro/Psych     Oriented x3: Yes   Mood/Affect: Normal         Dilation     Both eyes: 1.0% Mydriacyl, 2.5% Phenylephrine @ 10:11 AM           Slit Lamp and Fundus Exam     Slit Lamp Exam       Right Left   Lids/Lashes Normal Normal   Conjunctiva/Sclera melanosis melanosis   Cornea Clear tear film debris   Anterior Chamber Deep and clear, narrow temporal angle Deep and clear, narrow temporal angle   Iris Round and Dilated, no NVI Round and Dilated   Lens trace cortical changes trace cortical changes, trace  PSC, +vacuoles   Anterior Vitreous mild syneresis mild syneresis         Fundus Exam       Right Left   Disc Pink and Sharp mild pallor, sharp rim, mild PPP  C/D Ratio 0.6 0.5   Macula Flat, Good foveal reflex, scattered MA/DBH greatest temporal mac Flat, Good foveal reflex, scattered MA/DBH greatest temporally, +cystic changes   Vessels Attenuated, Tortuous, early NV inferior midzone- regressing Attenuated, Tortuous, copper wiring, +NV inferior midzone- regressing   Periphery Attached, scattered MA/DBH, good early PRP laser changes 360 Attached, scattered MA/DBH           IMAGING AND PROCEDURES  Imaging and Procedures for 03/21/2024  OCT, Retina - OU - Both Eyes       Right Eye Quality was good. Central Foveal Thickness: 226. Progression has been stable. Findings include normal foveal contour, no IRF, no SRF, vitreomacular adhesion (Mild diffuse retinal thinning).   Left Eye Quality was good. Central Foveal Thickness: 215. Progression has been stable. Findings include normal foveal contour, no SRF, intraretinal hyper-reflective material, intraretinal fluid (Focal cystic changes and IRHM temporal mac- slightly improved. Mild diffuse retinal thinning. ).   Notes *Images captured and stored on drive  Diagnosis / Impression:  Mild diffuse retinal thinning OU OS: Focal cystic changes and IRHM temporal mac  Clinical management:  See below  Abbreviations: NFP - Normal foveal profile. CME - cystoid macular edema. PED - pigment epithelial detachment. IRF - intraretinal fluid. SRF - subretinal fluid. EZ - ellipsoid zone. ERM - epiretinal membrane. ORA - outer retinal atrophy. ORT - outer retinal tubulation. SRHM - subretinal hyper-reflective material. IRHM - intraretinal hyper-reflective material      Panretinal Photocoagulation - OS - Left Eye       Time Out Confirmed correct patient, procedure, site, and patient consented.   Anesthesia Topical anesthesia was used.  Anesthetic medications included Proparacaine 0.5%.   Notes LASER PROCEDURE NOTE  Diagnosis:   Proliferative Diabetic Retinopathy, LEFT EYE  Procedure:  Pan-retinal photocoagulation using slit lamp laser, LEFT EYE  Anesthesia:  Topical  Surgeon: Redell Hans, MD, PhD   Informed consent obtained, operative eye marked, and time out performed prior to initiation of laser.   Lumenis [id number] slit lamp laser Pattern:  3x3 square Power: 240 mW Duration: 30 msec  Spot size: 500 microns  # spots: 1163 spots  Complications: None.  RTC: 10.29.25 - DFE/OCT, possible injxns  Patient tolerated the procedure well and received written and verbal post-procedure care information/education.          ASSESSMENT/PLAN:   ICD-10-CM   1. 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 Current use of insulin  (HCC)  Z79.4     3. Diabetes mellitus treated with injections of non-insulin  medication (HCC)  E11.9    Z79.85     4. Essential hypertension  I10     5. 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 Proliferative diabetic retinopathy w/o DME, OU  - A1C 20 December 2023 per pt report - s/p IVA OU #1 (08.6.25), #2 (09.03.25),  #3 (10.01.25) - s/p PRP OD (09.16.25) - FA (08.06.25) shows PDR OU, Scattered patches of vascular non profusion greatest inferior quadrant OU; Focal leaking NV inferior midzone OU - Pt would benefit from PRP OU - OCT shows Mild diffuse retinal thinning OU; OS: Focal cystic changes in IRHM temporal mac at 4 weeks. - BCVA OD 20/30 from 20/25 OS 20/30- stable -  recommend PRP OS today (10.13.25) with f/u on 10.29.25 as scheduled - pt wishes to proceed  - RBA of procedure discussed, questions answered - informed consent obtained and signed - see  procedure note - Avastin  informed consent obtained and signed 08.06.25 - f/u as scheduled on 10.29.25 - 4 weeks for DFE, OCT, possible injections  4,5. Hypertensive retinopathy OU - discussed importance of tight BP control - monitor   6. Ocular HTN OU  - IOP 25,21 - Continue Brimonidine BID OU, Cosopt BID OU   Ophthalmic Meds Ordered this visit:  No orders of the defined types were placed in this encounter.    Return for f/u as scheduled 10.29.25 , PDR, DFE, OCT, Possible, IVA, OU.  There are no Patient Instructions on file for this visit.  Explained the diagnoses, plan, and follow up with the patient and they expressed understanding.  Patient expressed understanding of the importance of proper follow up care.   This document serves as a record of services personally performed by Redell JUDITHANN Hans, MD, PhD. It was created on their behalf by Avelina Pereyra, COA an ophthalmic technician. The creation of this record is the provider's dictation and/or activities during the visit.   Electronically signed by: Avelina GORMAN Pereyra, COT  03/21/24  1:00 PM   This document serves as a record of services personally performed by Redell JUDITHANN Hans, MD, PhD. It was created on their behalf by Wanda GEANNIE Keens, COT an ophthalmic technician. The creation of this record is the provider's dictation and/or activities during the visit.    Electronically signed by:  Wanda GEANNIE Keens, COT  03/21/24 1:00 PM  Redell JUDITHANN Hans, M.D., Ph.D. Diseases & Surgery of the Retina and Vitreous [facility] 03/21/2024  I have reviewed the above documentation for accuracy and completeness, and I agree with the above. Redell JUDITHANN Hans, M.D., Ph.D. 03/21/24 1:03 PM    Abbreviations: M myopia (nearsighted); A astigmatism; H hyperopia (farsighted); P presbyopia; Mrx spectacle prescription;  CTL contact lenses; OD right eye; OS left eye; OU both eyes  XT exotropia; ET esotropia; PEK punctate epithelial  keratitis; PEE punctate epithelial erosions; DES dry eye syndrome; MGD meibomian gland dysfunction; ATs artificial tears; PFAT's preservative free artificial tears; NSC nuclear sclerotic cataract; PSC posterior subcapsular cataract; ERM epi-retinal membrane; PVD posterior vitreous detachment; RD retinal detachment; DM diabetes mellitus; DR diabetic retinopathy; NPDR non-proliferative diabetic retinopathy; PDR proliferative diabetic retinopathy; CSME clinically significant macular edema; DME diabetic macular edema; dbh dot blot hemorrhages; CWS cotton wool spot; POAG primary open angle glaucoma; C/D cup-to-disc ratio; HVF humphrey visual field; GVF goldmann visual field; OCT optical coherence tomography; IOP intraocular pressure; BRVO Branch retinal vein occlusion; CRVO central retinal vein occlusion; CRAO central retinal artery occlusion; BRAO branch retinal artery occlusion; RT retinal tear; SB scleral buckle; PPV pars plana vitrectomy; VH Vitreous hemorrhage; PRP panretinal laser photocoagulation; IVK intravitreal kenalog ; VMT vitreomacular traction; MH Macular hole;  NVD neovascularization of the disc; NVE neovascularization elsewhere; AREDS age related eye disease study; ARMD age related macular degeneration; POAG primary open angle glaucoma; EBMD epithelial/anterior basement membrane dystrophy; ACIOL anterior chamber intraocular lens; IOL intraocular lens; PCIOL posterior chamber intraocular lens; Phaco/IOL phacoemulsification with intraocular lens placement; PRK photorefractive keratectomy; LASIK laser assisted in situ keratomileusis; HTN hypertension; DM diabetes mellitus; COPD chronic obstructive pulmonary disease

## 2024-03-21 ENCOUNTER — Encounter (INDEPENDENT_AMBULATORY_CARE_PROVIDER_SITE_OTHER): Payer: Self-pay | Admitting: Ophthalmology

## 2024-03-21 ENCOUNTER — Ambulatory Visit (INDEPENDENT_AMBULATORY_CARE_PROVIDER_SITE_OTHER): Payer: MEDICAID | Admitting: Ophthalmology

## 2024-03-21 DIAGNOSIS — E119 Type 2 diabetes mellitus without complications: Secondary | ICD-10-CM

## 2024-03-21 DIAGNOSIS — E113513 Type 2 diabetes mellitus with proliferative diabetic retinopathy with macular edema, bilateral: Secondary | ICD-10-CM | POA: Diagnosis not present

## 2024-03-21 DIAGNOSIS — Z7985 Long-term (current) use of injectable non-insulin antidiabetic drugs: Secondary | ICD-10-CM | POA: Diagnosis not present

## 2024-03-21 DIAGNOSIS — I10 Essential (primary) hypertension: Secondary | ICD-10-CM

## 2024-03-21 DIAGNOSIS — Z794 Long term (current) use of insulin: Secondary | ICD-10-CM | POA: Diagnosis not present

## 2024-03-21 DIAGNOSIS — H35033 Hypertensive retinopathy, bilateral: Secondary | ICD-10-CM

## 2024-03-21 DIAGNOSIS — H40053 Ocular hypertension, bilateral: Secondary | ICD-10-CM

## 2024-03-29 ENCOUNTER — Other Ambulatory Visit: Payer: Self-pay | Admitting: Family Medicine

## 2024-03-29 DIAGNOSIS — R748 Abnormal levels of other serum enzymes: Secondary | ICD-10-CM

## 2024-03-29 NOTE — Progress Notes (Signed)
 [facility] - Clinic Note  04/06/2024   CHIEF COMPLAINT Patient presents for Retina Follow Up  HISTORY OF PRESENT ILLNESS: Albert [name] is a 38 y.o. male who presents to the clinic today for:  HPI     Retina Follow Up   Patient presents with  Diabetic Retinopathy.  In both eyes.  This started 2 months ago.  Duration of 2 weeks.  Since onset it is stable.  I, the attending physician,  performed the HPI with the patient and updated documentation appropriately.        Comments   Patient states vision the same OU. Pt states he is somewhat consistent with brimonidine bid OU. BS=tester came off A1c=11 or 12 about 1 month ago      Last edited by Valdemar Rogue, MD on 04/06/2024 12:52 PM.      Pt states he's doing well. Vision is good.   Referring physician: Octavia Bruckner, MD [address] [place],  [place] [address]  HISTORICAL INFORMATION:  Selected notes from the MEDICAL RECORD NUMBER Referred by Dr. Medford Octavia for retina eval LEE:  Ocular Hx- PMH-   CURRENT MEDICATIONS: Current Outpatient Medications (Ophthalmic Drugs)  Medication Sig   brimonidine (ALPHAGAN P) 0.1 % SOLN Place 1 drop into both eyes 2 (two) times daily.   dorzolamide-timolol (COSOPT) 2-0.5 % ophthalmic solution Place 1 drop into both eyes 2 (two) times daily.   prednisoLONE  acetate (PRED FORTE ) 1 % ophthalmic suspension Place 1 drop into the right eye 4 (four) times daily.   No current facility-administered medications for this visit. (Ophthalmic Drugs)   Current Outpatient Medications (Other)  Medication Sig   amLODipine (NORVASC) 10 MG tablet Take 10 mg by mouth daily.   atorvastatin  (LIPITOR) 10 MG tablet Take 1 tablet (10 mg total) by mouth daily.   cholecalciferol (VITAMIN D3) 25 MCG (1000 UT) tablet Take 1,000 Units by mouth daily.   ciprofloxacin (CIPRO) 500 MG tablet    Continuous Blood Gluc Receiver (DEXCOM G6 RECEIVER) DEVI Dexcom G6 Receiver    dapagliflozin  propanediol (FARXIGA ) 10 MG TABS tablet Take 1 tablet (10 mg total) by mouth daily.   Dulaglutide  (TRULICITY ) 3 MG/0.5ML SOPN Inject 3 mg into the skin once a week.   fluticasone (FLONASE) 50 MCG/ACT nasal spray fluticasone propionate 50 mcg/actuation nasal spray,suspension  USE 1 SPRAY(S) IN EACH NOSTRIL ONCE DAILY   folic acid (FOLVITE) 1 MG tablet folic acid 1 mg tablet  TAKE 1 TABLET BY MOUTH ONCE DAILY IN THE MORNING   gabapentin (NEURONTIN) 300 MG capsule gabapentin 300 mg capsule  Take 1 capsule every day by oral route at bedtime for 30 days.   haloperidol (HALDOL) 5 MG tablet haloperidol 5 mg tablet  Take 1 tablet every day by oral route at bedtime.   haloperidol decanoate (HALDOL DECANOATE) 50 MG/ML injection Inject into the muscle.   Insulin  Lispro Prot & Lispro (HUMALOG  MIX 75/25 KWIKPEN) (75-25) 100 UNIT/ML Kwikpen Inject 65 Units into the skin 2 (two) times daily before a meal. To replace novolin mix and Glimepiride   Insulin  Pen Needle 31G X 5 MM MISC 1 Device by Does not apply route in the morning and at bedtime.   ketoconazole (NIZORAL) 2 % cream ketoconazole 2 % topical cream  APPLY TO THE AFFECTED AREA(S) BY TOPICAL ROUTE - between your toes 2x a day   losartan (COZAAR) 50 MG tablet losartan 50 mg tablet  TAKE 1 TABLET BY MOUTH ONCE DAILY  IN THE MORNING   metroNIDAZOLE (FLAGYL) 500 MG tablet    rosuvastatin (CRESTOR) 20 MG tablet rosuvastatin 20 mg tablet  TAKE 1 TABLET BY MOUTH ONCE DAILY IN THE EVENING   triamcinolone  cream (KENALOG ) 0.1 % triamcinolone  acetonide 0.1 % topical cream  APPLY 1 APPLICATION TOPICALLY TWICE DAILY to eczema on your left elbow   No current facility-administered medications for this visit. (Other)   REVIEW OF SYSTEMS: ROS   Positive for: Endocrine, Eyes Negative for: Constitutional, Gastrointestinal, Neurological, Skin, Genitourinary, Musculoskeletal, HENT, Cardiovascular, Respiratory, Psychiatric, Allergic/Imm, Heme/Lymph Last  edited by Elnor Avelina RAMAN, COT on 04/06/2024  8:38 AM.         ALLERGIES Allergies  Allergen Reactions   Milk-Related Compounds     itching   PAST MEDICAL HISTORY Past Medical History:  Diagnosis Date   History of asthma    per pt as child   Phimosis    Type 2 diabetes mellitus treated with insulin  (HCC)    followed by pcp---  per pt checks sugar daily AM,  fasting average-- 190   Past Surgical History:  Procedure Laterality Date   NO PAST SURGERIES     FAMILY HISTORY History reviewed. No pertinent family history. SOCIAL HISTORY Social History   Tobacco Use   Smoking status: Every Day    Current packs/day: 0.50    Average packs/day: 0.5 packs/day for 8.0 years (4.0 ttl pk-yrs)    Types: Cigarettes   Smokeless tobacco: Never  Vaping Use   Vaping status: Former   Quit date: 05/17/2018  Substance Use Topics   Alcohol use: Yes    Comment: occasional beer   Drug use: No       OPHTHALMIC EXAM:  Base Eye Exam     Visual Acuity (Snellen - Linear)       Right Left   Dist cc 20/25 -2 20/40 -1   Dist ph cc NI 20/30 -1    Correction: Glasses         Tonometry (Tonopen, 8:46 AM)       Right Left   Pressure 33 35  SQUEEZING, HOLDING LIDS        Pupils       Pupils Dark Light Shape React APD   Right PERRL 3 2 Round Brisk None   Left PERRL 3 2 Round Brisk None         Visual Fields       Left Right    Full Full         Extraocular Movement       Right Left    Full, Ortho Full, Ortho         Neuro/Psych     Oriented x3: Yes   Mood/Affect: Normal         Dilation     Both eyes: 1.0% Mydriacyl, 2.5% Phenylephrine @ 8:47 AM           Slit Lamp and Fundus Exam     Slit Lamp Exam       Right Left   Lids/Lashes Normal Normal   Conjunctiva/Sclera melanosis melanosis   Cornea Clear tear film debris   Anterior Chamber Deep and clear, narrow temporal angle Deep and clear, narrow temporal angle   Iris Round and Dilated, no  NVI Round and Dilated   Lens trace cortical changes trace cortical changes, trace PSC, +vacuoles   Anterior Vitreous mild syneresis mild syneresis         Fundus Exam  Right Left   Disc Pink and Sharp mild pallor, sharp rim, mild PPP   C/D Ratio 0.6 0.5   Macula Flat, Good foveal reflex, scattered MA/DBH greatest temporal mac Flat, Good foveal reflex, scattered MA/DBH greatest temporally, +cystic changes   Vessels Attenuated, Tortuous, early NV inferior midzone- regressing Attenuated, Tortuous, copper wiring, +NV inferior midzone- regressing   Periphery Attached, scattered MA/DBH, good early PRP laser changes 360 Attached, scattered MA/DBH, good early laser changes 360           Refraction     Wearing Rx       Sphere Cylinder Axis   Right -3.50 +0.75 073   Left -2.75 +0.75 043    Type: SVL           IMAGING AND PROCEDURES  Imaging and Procedures for 04/06/2024  OCT, Retina - OU - Both Eyes       Right Eye Quality was good. Central Foveal Thickness: 227. Progression has been stable. Findings include normal foveal contour, no IRF, no SRF, vitreomacular adhesion (Mild diffuse retinal thinning).   Left Eye Quality was good. Central Foveal Thickness: 212. Progression has been stable. Findings include normal foveal contour, no SRF, intraretinal hyper-reflective material, intraretinal fluid (Focal cystic changes and IRHM temporal mac; Mild diffuse retinal thinning. ).   Notes *Images captured and stored on drive  Diagnosis / Impression:  Mild diffuse retinal thinning OU OS: Focal cystic changes and IRHM temporal mac; Mild diffuse retinal thinning.   Clinical management:  See below  Abbreviations: NFP - Normal foveal profile. CME - cystoid macular edema. PED - pigment epithelial detachment. IRF - intraretinal fluid. SRF - subretinal fluid. EZ - ellipsoid zone. ERM - epiretinal membrane. ORA - outer retinal atrophy. ORT - outer retinal tubulation. SRHM -  subretinal hyper-reflective material. IRHM - intraretinal hyper-reflective material      Intravitreal Injection, Pharmacologic Agent - OD - Right Eye       Time Out 04/06/2024. 8:18 AM. Confirmed correct patient, procedure, site, and patient consented.   Anesthesia Topical anesthesia was used. Anesthetic medications included Lidocaine  2%, Proparacaine 0.5%.   Procedure Preparation included 5% betadine to ocular surface, eyelid speculum. A (32g) needle was used.   Injection: 1.25 mg Bevacizumab  1.25mg /0.47ml   Route: Intravitreal, Site: Right Eye   NDC: C2662926, Lot: [id number], Expiration date: 07/14/2024   Post-op Post injection exam found visual acuity of at least counting fingers. The patient tolerated the procedure well. There were no complications. The patient received written and verbal post procedure care education. Post injection medications were not given.      Intravitreal Injection, Pharmacologic Agent - OS - Left Eye       Time Out 04/06/2024. 8:18 AM. Confirmed correct patient, procedure, site, and patient consented.   Anesthesia Topical anesthesia was used. Anesthetic medications included Lidocaine  2%, Proparacaine 0.5%.   Procedure Preparation included 5% betadine to ocular surface, eyelid speculum. A (32g) needle was used.   Injection: 1.25 mg Bevacizumab  1.25mg /0.72ml   Route: Intravitreal, Site: Left Eye   NDC: C2662926, Lot: [id number], Expiration date: 06/25/2024   Post-op Post injection exam found visual acuity of at least counting fingers. The patient tolerated the procedure well. There were no complications. The patient received written and verbal post procedure care education. Post injection medications were not given.           ASSESSMENT/PLAN:   ICD-10-CM   1. Proliferative diabetic retinopathy of both eyes with macular edema associated with type 2 diabetes  mellitus (HCC)  E11.3513 OCT, Retina - OU - Both Eyes    Intravitreal Injection,  Pharmacologic Agent - OD - Right Eye    Intravitreal Injection, Pharmacologic Agent - OS - Left Eye    Bevacizumab  (AVASTIN ) SOLN 1.25 mg    Bevacizumab  (AVASTIN ) SOLN 1.25 mg    2. Current use of insulin  (HCC)  Z79.4     3. Diabetes mellitus treated with injections of non-insulin  medication (HCC)  E11.9    Z79.85     4. Essential hypertension  I10     5. 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 Proliferative diabetic retinopathy w/o DME, OU  - A1C 20 December 2023 per pt report - s/p IVA OU #1 (08.6.25), #2 (09.03.25),  #3 (10.01.25) - s/p PRP OD (09.16.25) - s/p PRP OS  (10.13.25) - FA (08.06.25) shows PDR OU, Scattered patches of vascular non profusion greatest inferior quadrant OU; Focal leaking NV inferior midzone OU  - OCT shows Mild diffuse retinal thinning OU; OS: Focal cystic changes in IRHM temporal mac at 4 weeks. - BCVA OD 20/25 from 20/30OS 20/30- stable - recommend IVA OU today (10.29.25) with f/u ext to 4-5 weeks - pt wishes to proceed  - RBA of procedure discussed, questions answered - informed consent obtained and signed - see procedure note - Avastin  informed consent obtained and signed 08.06.25 - f/u 4-5 weeks for DFE, OCT, possible injections  4,5. Hypertensive retinopathy OU - discussed importance of tight BP control - monitor   6. Ocular HTN OU  - IOP 33,35-squeezing - Continue Brimonidine BID OU, Cosopt BID OU   Ophthalmic Meds Ordered this visit:  Meds ordered this encounter  Medications   Bevacizumab  (AVASTIN ) SOLN 1.25 mg   Bevacizumab  (AVASTIN ) SOLN 1.25 mg     Return for 4-5weeks PDR OU, DFE, OCT, Possible Injxn.  There are no Patient Instructions on file for this visit.  Explained the diagnoses, plan, and follow up with the patient and they expressed understanding.  Patient expressed understanding of the importance of proper follow up care.   This document serves as a record  of services personally performed by Redell JUDITHANN Hans, MD, PhD. It was created on their behalf by Almetta Pesa, an ophthalmic technician. The creation of this record is the provider's dictation and/or activities during the visit.    Electronically signed by: Almetta Pesa, OA, 04/10/24  2:22 PM  Redell JUDITHANN Hans, M.D., Ph.D. Diseases & Surgery of the Retina and Vitreous [facility] 04/06/2024  I have reviewed the above documentation for accuracy and completeness, and I agree with the above. Redell JUDITHANN Hans, M.D., Ph.D. 04/10/24 2:23 PM    Abbreviations: M myopia (nearsighted); A astigmatism; H hyperopia (farsighted); P presbyopia; Mrx spectacle prescription;  CTL contact lenses; OD right eye; OS left eye; OU both eyes  XT exotropia; ET esotropia; PEK punctate epithelial keratitis; PEE punctate epithelial erosions; DES dry eye syndrome; MGD meibomian gland dysfunction; ATs artificial tears; PFAT's preservative free artificial tears; NSC nuclear sclerotic cataract; PSC posterior subcapsular cataract; ERM epi-retinal membrane; PVD posterior vitreous detachment; RD retinal detachment; DM diabetes mellitus; DR diabetic retinopathy; NPDR non-proliferative diabetic retinopathy; PDR proliferative diabetic retinopathy; CSME clinically significant macular edema; DME diabetic macular edema; dbh dot blot hemorrhages; CWS cotton wool spot; POAG primary open angle glaucoma; C/D cup-to-disc ratio; HVF humphrey visual field; GVF goldmann visual field; OCT  optical coherence tomography; IOP intraocular pressure; BRVO Branch retinal vein occlusion; CRVO central retinal vein occlusion; CRAO central retinal artery occlusion; BRAO branch retinal artery occlusion; RT retinal tear; SB scleral buckle; PPV pars plana vitrectomy; VH Vitreous hemorrhage; PRP panretinal laser photocoagulation; IVK intravitreal kenalog ; VMT vitreomacular traction; MH Macular hole;  NVD neovascularization of the disc; NVE  neovascularization elsewhere; AREDS age related eye disease study; ARMD age related macular degeneration; POAG primary open angle glaucoma; EBMD epithelial/anterior basement membrane dystrophy; ACIOL anterior chamber intraocular lens; IOL intraocular lens; PCIOL posterior chamber intraocular lens; Phaco/IOL phacoemulsification with intraocular lens placement; PRK photorefractive keratectomy; LASIK laser assisted in situ keratomileusis; HTN hypertension; DM diabetes mellitus; COPD chronic obstructive pulmonary disease

## 2024-04-01 ENCOUNTER — Ambulatory Visit
Admission: RE | Admit: 2024-04-01 | Discharge: 2024-04-01 | Disposition: A | Discharge status: Home / Self Care | Payer: MEDICAID | Source: Ambulatory Visit | Attending: Family Medicine | Admitting: Family Medicine

## 2024-04-01 DIAGNOSIS — R748 Abnormal levels of other serum enzymes: Secondary | ICD-10-CM

## 2024-04-06 ENCOUNTER — Encounter (INDEPENDENT_AMBULATORY_CARE_PROVIDER_SITE_OTHER): Payer: Self-pay | Admitting: Ophthalmology

## 2024-04-06 ENCOUNTER — Other Ambulatory Visit: Payer: Self-pay | Admitting: Family Medicine

## 2024-04-06 ENCOUNTER — Ambulatory Visit (INDEPENDENT_AMBULATORY_CARE_PROVIDER_SITE_OTHER): Payer: MEDICAID | Admitting: Ophthalmology

## 2024-04-06 DIAGNOSIS — H40053 Ocular hypertension, bilateral: Secondary | ICD-10-CM

## 2024-04-06 DIAGNOSIS — H35033 Hypertensive retinopathy, bilateral: Secondary | ICD-10-CM

## 2024-04-06 DIAGNOSIS — E113513 Type 2 diabetes mellitus with proliferative diabetic retinopathy with macular edema, bilateral: Secondary | ICD-10-CM | POA: Diagnosis not present

## 2024-04-06 DIAGNOSIS — K769 Liver disease, unspecified: Secondary | ICD-10-CM

## 2024-04-06 DIAGNOSIS — Z7985 Long-term (current) use of injectable non-insulin antidiabetic drugs: Secondary | ICD-10-CM | POA: Diagnosis not present

## 2024-04-06 DIAGNOSIS — I10 Essential (primary) hypertension: Secondary | ICD-10-CM

## 2024-04-06 DIAGNOSIS — Z794 Long term (current) use of insulin: Secondary | ICD-10-CM

## 2024-04-06 MED ORDER — BEVACIZUMAB CHEMO INJECTION 1.25MG/0.05ML SYRINGE FOR KALEIDOSCOPE
1.2500 mg | INTRAVITREAL | Status: AC | PRN
  Administered 2024-04-06: 1.25 mg via INTRAVITREAL

## 2024-04-12 ENCOUNTER — Ambulatory Visit
Admission: RE | Admit: 2024-04-12 | Discharge: 2024-04-12 | Disposition: A | Discharge status: Home / Self Care | Payer: MEDICAID | Source: Ambulatory Visit | Attending: Family Medicine | Admitting: Family Medicine

## 2024-04-12 DIAGNOSIS — K769 Liver disease, unspecified: Secondary | ICD-10-CM

## 2024-04-26 NOTE — Progress Notes (Signed)
 [facility] - Clinic Note  05/10/2024   CHIEF COMPLAINT Patient presents for Retina Follow Up  HISTORY OF PRESENT ILLNESS: Albert [name] is a 38 y.o. male who presents to the clinic today for:  HPI     Retina Follow Up   Patient presents with  Diabetic Retinopathy.  In both eyes.  This started 5 weeks ago.  I, the attending physician,  performed the HPI with the patient and updated documentation appropriately.        Comments   Patient here for 5 weeks retina follow up for PDR OU. Patient states vision doing well. No eye pain.       Last edited by Valdemar Rogue, MD on 05/10/2024  1:05 PM.    Pt states he's doing well. Vision is good.   Referring physician: Octavia Bruckner, MD [address] [place],  [place] [address]  HISTORICAL INFORMATION:  Selected notes from the MEDICAL RECORD NUMBER Referred by Dr. Medford Octavia for retina eval LEE:  Ocular Hx- PMH-   CURRENT MEDICATIONS: Current Outpatient Medications (Ophthalmic Drugs)  Medication Sig   brimonidine (ALPHAGAN P) 0.1 % SOLN Place 1 drop into both eyes 2 (two) times daily.   dorzolamide -timolol  (COSOPT ) 2-0.5 % ophthalmic solution Place 1 drop into both eyes 2 (two) times daily.   prednisoLONE  acetate (PRED FORTE ) 1 % ophthalmic suspension Place 1 drop into the right eye 4 (four) times daily.   No current facility-administered medications for this visit. (Ophthalmic Drugs)   Current Outpatient Medications (Other)  Medication Sig   amLODipine (NORVASC) 10 MG tablet Take 10 mg by mouth daily.   atorvastatin  (LIPITOR) 10 MG tablet Take 1 tablet (10 mg total) by mouth daily.   cholecalciferol (VITAMIN D3) 25 MCG (1000 UT) tablet Take 1,000 Units by mouth daily.   ciprofloxacin (CIPRO) 500 MG tablet    Continuous Blood Gluc Receiver (DEXCOM G6 RECEIVER) DEVI Dexcom G6 Receiver   dapagliflozin  propanediol (FARXIGA ) 10 MG TABS tablet Take 1 tablet (10 mg total) by mouth daily.    Dulaglutide  (TRULICITY ) 3 MG/0.5ML SOPN Inject 3 mg into the skin once a week.   fluticasone (FLONASE) 50 MCG/ACT nasal spray fluticasone propionate 50 mcg/actuation nasal spray,suspension  USE 1 SPRAY(S) IN EACH NOSTRIL ONCE DAILY   folic acid (FOLVITE) 1 MG tablet folic acid 1 mg tablet  TAKE 1 TABLET BY MOUTH ONCE DAILY IN THE MORNING   gabapentin (NEURONTIN) 300 MG capsule gabapentin 300 mg capsule  Take 1 capsule every day by oral route at bedtime for 30 days.   haloperidol (HALDOL) 5 MG tablet haloperidol 5 mg tablet  Take 1 tablet every day by oral route at bedtime.   haloperidol decanoate (HALDOL DECANOATE) 50 MG/ML injection Inject into the muscle.   Insulin  Lispro Prot & Lispro (HUMALOG  MIX 75/25 KWIKPEN) (75-25) 100 UNIT/ML Kwikpen Inject 65 Units into the skin 2 (two) times daily before a meal. To replace novolin mix and Glimepiride   Insulin  Pen Needle 31G X 5 MM MISC 1 Device by Does not apply route in the morning and at bedtime.   ketoconazole (NIZORAL) 2 % cream ketoconazole 2 % topical cream  APPLY TO THE AFFECTED AREA(S) BY TOPICAL ROUTE - between your toes 2x a day   losartan (COZAAR) 50 MG tablet losartan 50 mg tablet  TAKE 1 TABLET BY MOUTH ONCE DAILY IN THE MORNING   metroNIDAZOLE (FLAGYL) 500 MG tablet    rosuvastatin (CRESTOR) 20 MG tablet  rosuvastatin 20 mg tablet  TAKE 1 TABLET BY MOUTH ONCE DAILY IN THE EVENING   triamcinolone  cream (KENALOG ) 0.1 % triamcinolone  acetonide 0.1 % topical cream  APPLY 1 APPLICATION TOPICALLY TWICE DAILY to eczema on your left elbow   No current facility-administered medications for this visit. (Other)   REVIEW OF SYSTEMS: ROS   Positive for: Endocrine, Eyes Negative for: Constitutional, Gastrointestinal, Neurological, Skin, Genitourinary, Musculoskeletal, HENT, Cardiovascular, Respiratory, Psychiatric, Allergic/Imm, Heme/Lymph Last edited by Orval Asberry RAMAN, COA on 05/10/2024  8:27 AM.          ALLERGIES Allergies   Allergen Reactions   Milk-Related Compounds     itching   PAST MEDICAL HISTORY Past Medical History:  Diagnosis Date   History of asthma    per pt as child   Phimosis    Type 2 diabetes mellitus treated with insulin  (HCC)    followed by pcp---  per pt checks sugar daily AM,  fasting average-- 190   Past Surgical History:  Procedure Laterality Date   NO PAST SURGERIES     FAMILY HISTORY History reviewed. No pertinent family history. SOCIAL HISTORY Social History   Tobacco Use   Smoking status: Every Day    Current packs/day: 0.50    Average packs/day: 0.5 packs/day for 8.0 years (4.0 ttl pk-yrs)    Types: Cigarettes   Smokeless tobacco: Never  Vaping Use   Vaping status: Former   Quit date: 05/17/2018  Substance Use Topics   Alcohol use: Yes    Comment: occasional beer   Drug use: No       OPHTHALMIC EXAM:  Base Eye Exam     Visual Acuity (Snellen - Linear)       Right Left   Dist cc 20/30 20/30 -1   Dist ph cc 20/25 -2 20/30 +1    Correction: Glasses         Tonometry (Tonopen, 8:25 AM)       Right Left   Pressure 21 19         Pupils       Dark Light Shape React APD   Right 3 2 Round Brisk None   Left 3 2 Round Brisk None         Visual Fields (Counting fingers)       Left Right    Full Full         Extraocular Movement       Right Left    Full, Ortho Full, Ortho         Neuro/Psych     Oriented x3: Yes   Mood/Affect: Normal         Dilation     Both eyes: 1.0% Mydriacyl, 2.5% Phenylephrine @ 8:25 AM           Slit Lamp and Fundus Exam     Slit Lamp Exam       Right Left   Lids/Lashes Normal Normal   Conjunctiva/Sclera melanosis melanosis   Cornea Clear tear film debris   Anterior Chamber Deep and clear, narrow temporal angle Deep and clear, narrow temporal angle   Iris Round and Dilated, no NVI Round and Dilated   Lens trace cortical changes trace cortical changes, trace PSC, +vacuoles   Anterior  Vitreous mild syneresis mild syneresis         Fundus Exam       Right Left   Disc Pink and Sharp mild pallor, sharp rim, mild PPP   C/D  Ratio 0.6 0.5   Macula Flat, Good foveal reflex, scattered MA/DBH greatest temporal mac Flat, Good foveal reflex, scattered MA/DBH greatest temporally, +cystic changes--improved   Vessels Attenuated, Tortuous, early NV inferior midzone- regressing Attenuated, Tortuous, copper wiring, +NV inferior midzone- regressing   Periphery Attached, scattered MA/DBH, good PRP laser changes 360 Attached, scattered MA/DBH, good laser changes 360           Refraction     Wearing Rx       Sphere Cylinder Axis   Right -3.50 +0.75 073   Left -2.75 +0.75 043    Type: SVL           IMAGING AND PROCEDURES  Imaging and Procedures for 05/10/2024  OCT, Retina - OU - Both Eyes       Right Eye Quality was good. Central Foveal Thickness: 220. Progression has been stable. Findings include normal foveal contour, no IRF, no SRF, vitreomacular adhesion (Mild diffuse retinal thinning, no fluid or edema).   Left Eye Quality was good. Central Foveal Thickness: 207. Progression has been stable. Findings include normal foveal contour, no SRF, intraretinal hyper-reflective material, intraretinal fluid (Focal cystic changes and IRHM temporal mac-- stably improved; Mild diffuse retinal thinning, no fluid or edema).   Notes *Images captured and stored on drive  Diagnosis / Impression:  Mild diffuse retinal thinning OU OS: Focal cystic changes and IRHM temporal mac-- stably improved; Mild diffuse retinal thinning, no fluid or edema  Clinical management:  See below  Abbreviations: NFP - Normal foveal profile. CME - cystoid macular edema. PED - pigment epithelial detachment. IRF - intraretinal fluid. SRF - subretinal fluid. EZ - ellipsoid zone. ERM - epiretinal membrane. ORA - outer retinal atrophy. ORT - outer retinal tubulation. SRHM - subretinal hyper-reflective  material. IRHM - intraretinal hyper-reflective material      Intravitreal Injection, Pharmacologic Agent - OD - Right Eye       Time Out 05/10/2024. 8:50 AM. Confirmed correct patient, procedure, site, and patient consented.   Anesthesia Topical anesthesia was used. Anesthetic medications included Lidocaine  2%, Proparacaine 0.5%.   Procedure Preparation included 5% betadine to ocular surface, eyelid speculum. A (32g) needle was used.   Injection: 1.25 mg Bevacizumab  1.25mg /0.105ml   Route: Intravitreal, Site: Right Eye   NDC: C2662926, Lot: [id number], Expiration date: 07/30/2024   Post-op Post injection exam found visual acuity of at least counting fingers. The patient tolerated the procedure well. There were no complications. The patient received written and verbal post procedure care education. Post injection medications were not given.      Intravitreal Injection, Pharmacologic Agent - OS - Left Eye       Time Out 05/10/2024. 8:50 AM. Confirmed correct patient, procedure, site, and patient consented.   Anesthesia Topical anesthesia was used. Anesthetic medications included Lidocaine  2%, Proparacaine 0.5%.   Procedure Preparation included 5% betadine to ocular surface, eyelid speculum. A supplied (32g) needle was used.   Injection: 1.25 mg Bevacizumab  1.25mg /0.69ml   Route: Intravitreal, Site: Left Eye   NDC: 49757-939-98, Lot: [id number]$ , Expiration date: 06/09/2024   Post-op Post injection exam found visual acuity of at least counting fingers. The patient tolerated the procedure well. There were no complications. The patient received written and verbal post procedure care education. Post injection medications were not given.            ASSESSMENT/PLAN:   ICD-10-CM   1. 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 Current use of insulin  (HCC)  Z79.4     3. Diabetes mellitus treated with injections of non-insulin  medication (HCC)  E11.9    Z79.85     4. Essential hypertension  I10     5. 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 Proliferative diabetic retinopathy w/o DME, OU  - A1C 20 December 2023 per pt report - s/p IVA OU #1 (08.6.25), #2 (09.03.25),  #3 (10.01.25), #4 (10.29.25) - s/p PRP OD (09.16.25) - s/p PRP OS  (10.13.25) - FA (08.06.25) shows PDR OU, Scattered patches of vascular non profusion greatest inferior quadrant OU; Focal leaking NV inferior midzone OU--repeat FA ~April 2026 - OCT shows Mild diffuse retinal thinning OU; OS: Focal cystic changes and IRHM temporal mac-- stably improved; Mild diffuse retinal thinning, no fluid or edema at 4.9 weeks. - BCVA OD 20/25 from 20/30OS 20/30- stable - recommend IVA OU #5 today (12.02.25) with f/u ext to 6-8 weeks - pt wishes to proceed  - RBA of procedure discussed, questions answered - informed consent obtained and signed - see procedure note - Avastin  informed consent obtained and signed 08.06.25 - f/u 6-8 weeks for DFE, OCT, possible injections  4,5. Hypertensive retinopathy OU - discussed importance of tight BP control - monitor   6. Ocular HTN OU  - IOP 21,19-improved - Continue Brimonidine BID OU, Cosopt  BID OU   Ophthalmic Meds Ordered this visit:  Meds ordered this encounter  Medications   Bevacizumab  (AVASTIN ) SOLN 1.25 mg   Bevacizumab  (AVASTIN ) SOLN 1.25 mg     Return for 6-8 weeks PDR OU, DFE, OCT, Possible Injxn.  There are no Patient Instructions on file for this visit.  Explained the diagnoses, plan, and follow up with the patient and they expressed understanding.  Patient expressed  understanding of the importance of proper follow up care.   This document serves as a record of services personally performed by Redell JUDITHANN Hans, MD, PhD. It was created on their behalf by Wanda GEANNIE Keens, COT an ophthalmic technician. The creation of this record is the provider's dictation and/or activities during the visit.    Electronically signed by:  Wanda GEANNIE Keens, COT  05/10/24 1:06 PM  This document serves as a record of services personally performed by Redell JUDITHANN Hans, MD, PhD. It was created on their behalf by Almetta Pesa, an ophthalmic technician. The creation of this record is the provider's dictation and/or activities during the visit.    Electronically signed by: Almetta Pesa, OA, 05/10/24  1:06 PM  Redell JUDITHANN Hans, M.D., Ph.D. Diseases & Surgery of the Retina and Vitreous [facility] 05/10/2024  I have reviewed the above documentation for accuracy and completeness, and I agree with the above. Redell JUDITHANN Hans, M.D., Ph.D. 05/10/24 1:07 PM   Abbreviations: M myopia (nearsighted); A astigmatism; H hyperopia (farsighted); P presbyopia; Mrx spectacle prescription;  CTL contact lenses; OD right eye; OS left eye; OU both eyes  XT exotropia; ET esotropia; PEK punctate epithelial keratitis; PEE punctate epithelial erosions; DES dry eye syndrome; MGD meibomian gland dysfunction; ATs artificial tears;  PFAT's preservative free artificial tears; NSC nuclear sclerotic cataract; PSC posterior subcapsular cataract; ERM epi-retinal membrane; PVD posterior vitreous detachment; RD retinal detachment; DM diabetes mellitus; DR diabetic retinopathy; NPDR non-proliferative diabetic retinopathy; PDR proliferative diabetic retinopathy; CSME clinically significant macular edema; DME diabetic macular edema; dbh dot blot hemorrhages; CWS cotton wool spot; POAG primary open angle glaucoma; C/D cup-to-disc ratio; HVF humphrey visual field; GVF goldmann visual field; OCT  optical coherence tomography; IOP intraocular pressure; BRVO Branch retinal vein occlusion; CRVO central retinal vein occlusion; CRAO central retinal artery occlusion; BRAO branch retinal artery occlusion; RT retinal tear; SB scleral buckle; PPV pars plana vitrectomy; VH Vitreous hemorrhage; PRP panretinal laser photocoagulation; IVK intravitreal kenalog ; VMT vitreomacular traction; MH Macular hole;  NVD neovascularization of the disc; NVE neovascularization elsewhere; AREDS age related eye disease study; ARMD age related macular degeneration; POAG primary open angle glaucoma; EBMD epithelial/anterior basement membrane dystrophy; ACIOL anterior chamber intraocular lens; IOL intraocular lens; PCIOL posterior chamber intraocular lens; Phaco/IOL phacoemulsification with intraocular lens placement; PRK photorefractive keratectomy; LASIK laser assisted in situ keratomileusis; HTN hypertension; DM diabetes mellitus; COPD chronic obstructive pulmonary disease

## 2024-05-10 ENCOUNTER — Encounter (INDEPENDENT_AMBULATORY_CARE_PROVIDER_SITE_OTHER): Payer: Self-pay | Admitting: Ophthalmology

## 2024-05-10 ENCOUNTER — Ambulatory Visit (INDEPENDENT_AMBULATORY_CARE_PROVIDER_SITE_OTHER): Payer: MEDICAID | Admitting: Ophthalmology

## 2024-05-10 DIAGNOSIS — H35033 Hypertensive retinopathy, bilateral: Secondary | ICD-10-CM

## 2024-05-10 DIAGNOSIS — E113513 Type 2 diabetes mellitus with proliferative diabetic retinopathy with macular edema, bilateral: Secondary | ICD-10-CM | POA: Diagnosis not present

## 2024-05-10 DIAGNOSIS — Z794 Long term (current) use of insulin: Secondary | ICD-10-CM

## 2024-05-10 DIAGNOSIS — H40053 Ocular hypertension, bilateral: Secondary | ICD-10-CM

## 2024-05-10 DIAGNOSIS — I10 Essential (primary) hypertension: Secondary | ICD-10-CM | POA: Diagnosis not present

## 2024-05-10 DIAGNOSIS — E119 Type 2 diabetes mellitus without complications: Secondary | ICD-10-CM

## 2024-05-10 DIAGNOSIS — Z7985 Long-term (current) use of injectable non-insulin antidiabetic drugs: Secondary | ICD-10-CM | POA: Diagnosis not present

## 2024-05-10 MED ORDER — BEVACIZUMAB CHEMO INJECTION 1.25MG/0.05ML SYRINGE FOR KALEIDOSCOPE
1.2500 mg | INTRAVITREAL | Status: AC | PRN
  Administered 2024-05-10: 1.25 mg via INTRAVITREAL

## 2024-05-12 ENCOUNTER — Other Ambulatory Visit: Payer: Self-pay | Admitting: Family Medicine

## 2024-05-12 ENCOUNTER — Ambulatory Visit
Admission: RE | Admit: 2024-05-12 | Discharge: 2024-05-12 | Disposition: A | Payer: MEDICAID | Source: Ambulatory Visit | Attending: Family Medicine | Admitting: Family Medicine

## 2024-05-12 DIAGNOSIS — R0789 Other chest pain: Secondary | ICD-10-CM

## 2024-05-25 ENCOUNTER — Ambulatory Visit: Payer: MEDICAID | Admitting: Podiatry

## 2024-05-26 ENCOUNTER — Ambulatory Visit: Payer: MEDICAID | Admitting: Podiatry

## 2024-06-12 NOTE — Progress Notes (Unsigned)
 " Cardiology Office Note:  .   Date:  06/15/2024  ID:  Albert [name], DOB March 02, 1986, MRN [id number] PCP: Roanna Ezekiel NOVAK, MD  [facility] Providers Cardiologist:  None   History of Present Illness: .    Chief Complaint  Patient presents with   Chest Pain    Albert [name] is a 39 y.o. male with below history who presents for the evaluation of chest pain at the request of Bakare, Mobolaji B, MD.  History of Present Illness   Albert [name] is a 39 year old male with hypertension, diabetes, and hyperlipidemia who presents with chest pain. He is accompanied by his partner, Bobbette.  He experienced chest pain described as a burning sensation, similar to heartburn, which occurred last month. The pain was associated with eating and was relieved by lying down and sleeping. He did not take any medication for the chest pain and currently denies any chest pain or trouble breathing.  He has a history of diabetes with a recent A1c of 12.8. Poorly controlled for years. He has had diabetes since early adulthood and experiences symptoms of diabetic neuropathy, including numbness and sharp pain in his feet. He is currently on insulin , Farxiga , and Mounjaro for diabetes management.  His hypertension is managed with amlodipine 10 mg and losartan 50 mg daily. He denies any history of heart attack or stroke.  He has hyperlipidemia and is currently taking Lipitor 10 mg daily. His lipid profile is due for reevaluation.  Social history reveals a 15-year history of smoking half a pack per day and occasional alcohol consumption. He is currently unemployed and not on disability. He lives with his partner, Bobbette, and he does not have children.  Family history is positive for heart disease. His mother and father are alive.           Problem List HTN DM -A1c 11.6 HLD Tobacco abuse     ROS: All other ROS reviewed and negative. Pertinent positives noted in the HPI.     Studies Reviewed: SABRA    EKG Interpretation Date/Time:  Wednesday June 15 2024 08:18:46 EST Ventricular Rate:  104 PR Interval:  172 QRS Duration:  76 QT Interval:  336 QTC Calculation: 441 R Axis:   -21  Text Interpretation: Sinus tachycardia Minimal voltage criteria for LVH, may be normal variant ( R in aVL ) Nonspecific T wave abnormality Confirmed by Barbaraann Kotyk [phone]) on 06/15/2024 8:24:46 AM   Physical Exam:   VS:  BP 112/76   Pulse (!) 104   Ht 5' 9 (1.753 m)   Wt 243 lb 6.4 oz (110.4 kg)   SpO2 97%   BMI 35.94 kg/m    Wt Readings from Last 3 Encounters:  06/15/24 243 lb 6.4 oz (110.4 kg)  12/18/21 227 lb (103 kg)  04/04/21 246 lb (111.6 kg)    GEN: Well nourished, well developed in no acute distress NECK: No JVD; No carotid bruits CARDIAC: RRR, no murmurs, rubs, gallops RESPIRATORY:  Clear to auscultation without rales, wheezing or rhonchi  ABDOMEN: Soft, non-tender, non-distended EXTREMITIES:  No edema; No deformity  ASSESSMENT AND PLAN: .   Assessment and Plan    Chest pain, possibly cardiac  Intermittent chest pain with a burning sensation, previously associated with eating and relieved by lying down. EKG shows sinus tachycardia with nonspecific STT changes. Differential includes coronary artery disease, especially given diabetes and smoking history. Diabetics may have atypical symptoms due to neuropathy, increasing risk of undetected myocardial infarction. -  Ordered echocardiogram to assess cardiac function. - Ordered coronary CTA to evaluate for obstructive coronary artery disease. - Ordered labs including BMP and lipid profile. - Instructed to take 100 mg of metoprolol  on the day of the coronary CTA.  Type 2 diabetes mellitus with diabetic polyneuropathy Diabetes poorly controlled with A1c of 11.6. Experiences diabetic polyneuropathy with numbness and sharp pain in feet. High risk for cardiovascular complications due to poor glycemic control. - Referred to endocrinologist for  diabetes management. - Emphasized importance of glycemic control to prevent complications.  Hypertension Well controlled on current medication regimen. - Continue current antihypertensive medications: Norvasc 10 mg daily and losartan 50 mg daily.  Mixed hyperlipidemia Currently managed with Lipitor 10 mg. No recent lipid profile available. - Ordered lipid profile to assess current status. - Continue Lipitor 10 mg daily.  Tobacco use disorder Chronic tobacco use with a history of smoking half a pack per day for 15 years. Smoking significantly increases cardiovascular risk, especially in the context of diabetes. - Strongly advised smoking cessation. - Discussed risks of continued smoking, including increased risk of myocardial infarction and stroke.                Follow-up: Return in about 3 months (around 09/13/2024).  Signed, Darryle DASEN. Barbaraann, MD, [facility]  [facility]  [address] [place], [place] [address] [phone]  8:59 AM   "

## 2024-06-15 ENCOUNTER — Encounter: Payer: Self-pay | Admitting: Cardiovascular Disease

## 2024-06-15 ENCOUNTER — Ambulatory Visit: Payer: MEDICAID | Attending: Cardiovascular Disease | Admitting: Cardiovascular Disease

## 2024-06-15 VITALS — BP 112/76 | HR 104 | Ht 69.0 in | Wt 243.4 lb

## 2024-06-15 DIAGNOSIS — R072 Precordial pain: Secondary | ICD-10-CM

## 2024-06-15 DIAGNOSIS — R079 Chest pain, unspecified: Secondary | ICD-10-CM | POA: Diagnosis present

## 2024-06-15 DIAGNOSIS — Z720 Tobacco use: Secondary | ICD-10-CM

## 2024-06-15 DIAGNOSIS — Z79899 Other long term (current) drug therapy: Secondary | ICD-10-CM | POA: Diagnosis present

## 2024-06-15 DIAGNOSIS — E782 Mixed hyperlipidemia: Secondary | ICD-10-CM | POA: Diagnosis present

## 2024-06-15 DIAGNOSIS — I150 Renovascular hypertension: Secondary | ICD-10-CM

## 2024-06-15 LAB — BASIC METABOLIC PANEL WITH GFR
BUN/Creatinine Ratio: 14 (ref 9–20)
BUN: 14 mg/dL (ref 6–24)
CO2: 20 mmol/L (ref 20–29)
Calcium: 9.3 mg/dL (ref 8.7–10.2)
Chloride: 96 mmol/L (ref 96–106)
Creatinine, Ser: 0.97 mg/dL (ref 0.76–1.27)
Glucose: 352 mg/dL — ABNORMAL HIGH (ref 70–99)
Potassium: 4.9 mmol/L (ref 3.5–5.2)
Sodium: 131 mmol/L — ABNORMAL LOW (ref 134–144)
eGFR: 101 mL/min/1.73

## 2024-06-15 LAB — LIPID PANEL
Chol/HDL Ratio: 2.8 ratio (ref 0.0–5.0)
Cholesterol, Total: 149 mg/dL (ref 100–199)
HDL: 53 mg/dL
LDL Chol Calc (NIH): 79 mg/dL (ref 0–99)
Triglycerides: 89 mg/dL (ref 0–149)
VLDL Cholesterol Cal: 17 mg/dL (ref 5–40)

## 2024-06-15 MED ORDER — METOPROLOL TARTRATE 100 MG PO TABS: 100.0000 mg | ORAL_TABLET | Freq: Once | ORAL | 0 refills | Status: AC

## 2024-06-15 NOTE — Patient Instructions (Signed)
 Medication Instructions:  Metoprolol  100 mg 2 hours before test.  *If you need a refill on your cardiac medications before your next appointment, please call your pharmacy*  Lab Work: Today BMET, Lipid Panel If you have labs (blood work) drawn today and your tests are completely normal, you will receive your results only by: MyChart Message (if you have MyChart) OR A paper copy in the mail If you have any lab test that is abnormal or we need to change your treatment, we will call you to review the results.  Testing/Procedures: Echocardiogram Your physician has requested that you have an echocardiogram. Echocardiography is a painless test that uses sound waves to create images of your heart. It provides your doctor with information about the size and shape of your heart and how well your hearts chambers and valves are working. This procedure takes approximately one hour. There are no restrictions for this procedure. Please do NOT wear cologne, perfume, aftershave, or lotions (deodorant is allowed). Please arrive 15 minutes prior to your appointment time.  Please note: We ask at that you not bring children with you during ultrasound (echo/ vascular) testing. Due to room size and safety concerns, children are not allowed in the ultrasound rooms during exams. Our front office staff cannot provide observation of children in our lobby area while testing is being conducted. An adult accompanying a patient to their appointment will only be allowed in the ultrasound room at the discretion of the ultrasound technician under special circumstances. We apologize for any inconvenience.   Coronary CTA   Your cardiac CT will be scheduled at one of the below locations:   [facility] [address] [place], [place] [address] [phone]  OR  [facility] [address] [place], [place] [address] [phone]  OR   [facility] [address] [place], [place] [address] [phone]  If scheduled at [facility], please arrive at the [facility] and Children's Entrance (Entrance C2) of [facility] 30 minutes prior to test start time. You can use the FREE valet parking offered at entrance C (encouraged to control the heart rate for the test)  Proceed to the [facility] Radiology Department (first floor) to check-in and test prep.  All radiology patients and guests should use entrance C2 at [facility], accessed from [facility], even though the hospital's physical address listed is [address]    If scheduled at [facility] or [facility], please arrive 15 mins early for check-in and test prep.  There is spacious parking and easy access to the radiology department from the [facility] Heart and Vascular entrance. Please enter here and check-in with the desk attendant.   Please follow these instructions carefully (unless otherwise directed):  An IV will be required for this test and Nitroglycerin will be given.  Hold all erectile dysfunction medications at least 3 days (72 hrs) prior to test. (Ie viagra, cialis, sildenafil, tadalafil, etc)   On the Night Before the Test: Be sure to Drink plenty of water. Do not consume any caffeinated/decaffeinated beverages or chocolate 12 hours prior to your test. Do not take any antihistamines 12 hours prior to your test.  On the Day of the Test: Drink plenty of water until 1 hour prior to the test. Do not eat any food 1 hour prior to test. You may take your regular medications prior to the test.  Take metoprolol  (Lopressor ) two hours prior to test. If you take Furosemide/Hydrochlorothiazide/Spironolactone, please HOLD on the morning of the test. FEMALES- please wear underwire-free bra if available, avoid dresses & tight clothing  After the Test: Drink plenty of  water. After receiving IV contrast, you may experience a mild flushed feeling. This is normal. On occasion, you may experience a mild rash up to 24 hours after the test. This is not dangerous. If this occurs, you can take Benadryl 25 mg and increase your fluid intake. If you experience trouble breathing, this can be serious. If it is severe call 911 IMMEDIATELY. If it is mild, please call our office. If you take any of these medications: Glipizide/Metformin, Avandament, Glucavance, please do not take 48 hours after completing test unless otherwise instructed.  We will call to schedule your test 2-4 weeks out understanding that some insurance companies will need an authorization prior to the service being performed.   For more information and frequently asked questions, please visit our website : [email or web address]  For non-scheduling related questions, please contact the cardiac imaging nurse navigator should you have any questions/concerns: Cardiac Imaging Nurse Navigators Direct Office Dial: [phone]   For scheduling needs, including cancellations and rescheduling, please call Brittany, [phone].   Follow-Up: At [facility], you and your health needs are our priority.  As part of our continuing mission to provide you with exceptional heart care, our providers are all part of one team.  This team includes your primary Cardiologist (physician) and Advanced Practice Providers or APPs (Physician Assistants and Nurse Practitioners) who all work together to provide you with the care you need, when you need it.  Your next appointment:   3 months  Provider:   Dr.  Darryle Decent    We recommend signing up for the patient portal called MyChart.  Sign up information is provided on this After Visit Summary.  MyChart is used to connect with patients for Virtual Visits (Telemedicine).  Patients are able to view lab/test results, encounter notes, upcoming appointments,  etc.  Non-urgent messages can be sent to your provider as well.   To learn more about what you can do with MyChart, go to [email or web address].   Other Instructions None

## 2024-06-16 ENCOUNTER — Ambulatory Visit: Payer: Self-pay | Admitting: Cardiovascular Disease

## 2024-06-20 ENCOUNTER — Telehealth: Payer: Self-pay | Admitting: Cardiovascular Disease

## 2024-06-20 NOTE — Telephone Encounter (Signed)
 Called and spoke to pt. Reviewed IN DETAIL the pt instructions for the Cardiac CT scheduled for 07/01/24. He was also asking if he should take his Amlodipine and Losartan; advised that he SHOULD take these medications that day. Also advised him to bring the metoprolol  tartrate 100 mg pill with him and that he should take this sometime between 11:00 am and 11:30 am. He will be in Echo Dept at this time (Echo scheduled same day at 11:00 am), and advised pt to ask Echo staff to check his BP and to hold metoprolol  if SBP is less than 110 (these were the pt admin instructions on RX sent in) . Another family member was also listening per patient's request. They verbalized understanding.

## 2024-06-20 NOTE — Telephone Encounter (Signed)
 Pt c/o medication issue:  1. Name of Medication:  Metoprolol   2. How are you currently taking this medication (dosage and times per day)?    3. Are you having a reaction (difficulty breathing--STAT)?    4. What is your medication issue?  Patient would like to know exactly when to take Metoprolol  prior to 1/23 CT. Please Advise

## 2024-06-21 ENCOUNTER — Encounter (INDEPENDENT_AMBULATORY_CARE_PROVIDER_SITE_OTHER): Payer: MEDICAID | Admitting: Ophthalmology

## 2024-06-28 NOTE — Progress Notes (Shared)
 " [facility] - Clinic Note  07/05/2024   CHIEF COMPLAINT Patient presents for No chief complaint on file.  HISTORY OF PRESENT ILLNESS: Albert [name] is a 39 y.o. male who presents to the clinic today for:   Pt states he's doing well. Vision is good.   Referring physician: Octavia Bruckner, MD [address] [place],  [place] [address]  HISTORICAL INFORMATION:  Selected notes from the MEDICAL RECORD NUMBER Referred by Dr. Medford Octavia for retina eval LEE:  Ocular Hx- PMH-   CURRENT MEDICATIONS: No current outpatient medications on file. (Ophthalmic Drugs)   No current facility-administered medications for this visit. (Ophthalmic Drugs)   Current Outpatient Medications (Other)  Medication Sig   amLODipine (NORVASC) 10 MG tablet Take 10 mg by mouth daily.   atorvastatin  (LIPITOR) 10 MG tablet Take 1 tablet (10 mg total) by mouth daily.   cholecalciferol (VITAMIN D3) 25 MCG (1000 UT) tablet Take 1,000 Units by mouth daily.   Continuous Blood Gluc Receiver (DEXCOM G6 RECEIVER) DEVI Dexcom G6 Receiver   dapagliflozin  propanediol (FARXIGA ) 10 MG TABS tablet Take 1 tablet (10 mg total) by mouth daily.   Insulin  Lispro Prot & Lispro (HUMALOG  MIX 75/25 KWIKPEN) (75-25) 100 UNIT/ML Kwikpen Inject 65 Units into the skin 2 (two) times daily before a meal. To replace novolin mix and Glimepiride   Insulin  Pen Needle 31G X 5 MM MISC 1 Device by Does not apply route in the morning and at bedtime.   ketoconazole (NIZORAL) 2 % cream ketoconazole 2 % topical cream  APPLY TO THE AFFECTED AREA(S) BY TOPICAL ROUTE - between your toes 2x a day (Patient taking differently: As needed)   losartan (COZAAR) 50 MG tablet losartan 50 mg tablet  TAKE 1 TABLET BY MOUTH ONCE DAILY IN THE MORNING   metoprolol  tartrate (LOPRESSOR ) 100 MG tablet Take 1 tablet (100 mg total) by mouth once. Take 90-120 minutes prior to scan. Hold for SBP less than 110.   MOUNJARO 2.5 MG/0.5ML Pen Inject  2.5 mg into the skin once a week.   triamcinolone  cream (KENALOG ) 0.1 % triamcinolone  acetonide 0.1 % topical cream  APPLY 1 APPLICATION TOPICALLY TWICE DAILY to eczema on your left elbow (Patient taking differently: As needed)   No current facility-administered medications for this visit. (Other)   REVIEW OF SYSTEMS:        ALLERGIES Allergies  Allergen Reactions   Milk-Related Compounds     itching   PAST MEDICAL HISTORY Past Medical History:  Diagnosis Date   History of asthma    per pt as child   Hypertension    Phimosis    Type 2 diabetes mellitus treated with insulin  (HCC)    followed by pcp---  per pt checks sugar daily AM,  fasting average-- 190   Past Surgical History:  Procedure Laterality Date   NO PAST SURGERIES     FAMILY HISTORY Family History  Problem Relation Age of Onset   Hypertension Father    SOCIAL HISTORY Social History   Tobacco Use   Smoking status: Every Day    Current packs/day: 0.50    Average packs/day: 0.5 packs/day for 15.0 years (7.5 ttl pk-yrs)    Types: Cigarettes   Smokeless tobacco: Never  Vaping Use   Vaping status: Former   Quit date: 05/17/2018  Substance Use Topics   Alcohol use: Yes    Comment: occasional beer   Drug use: No  OPHTHALMIC EXAM:  Not recorded    IMAGING AND PROCEDURES  Imaging and Procedures for 07/05/2024          ASSESSMENT/PLAN: No diagnosis found.  1-3. Proliferative diabetic retinopathy w/o DME, OU  - A1C 20 December 2023 per pt report - s/p IVA OU #1 (08.6.25), #2 (09.03.25),  #3 (10.01.25), #4 (10.29.25), #5 (10.02.25) - s/p PRP OD (09.16.25) - s/p PRP OS  (10.13.25) - FA (08.06.25) shows PDR OU, Scattered patches of vascular non profusion greatest inferior quadrant OU; Focal leaking NV inferior midzone OU--repeat FA ~April 2026 - OCT shows Mild diffuse retinal thinning OU; OS: Focal cystic changes and IRHM temporal mac-- stably improved; Mild diffuse retinal thinning, no fluid  or edema at 4.9 weeks. - BCVA OD 20/25 from 20/30OS 20/30- stable - recommend IVA OU #6 today (01.27.26) with f/u ext to 6-8 weeks - pt wishes to proceed  - RBA of procedure discussed, questions answered - informed consent obtained and signed - see procedure note - Avastin  informed consent obtained and signed 08.06.25 - f/u 6-8 weeks for DFE, OCT, possible injections  4,5. Hypertensive retinopathy OU - discussed importance of tight BP control - monitor   6. Ocular HTN OU  - IOP 21,19-improved - Continue Brimonidine BID OU, Cosopt  BID OU   Ophthalmic Meds Ordered this visit:  No orders of the defined types were placed in this encounter.    No follow-ups on file.  There are no Patient Instructions on file for this visit.  Explained the diagnoses, plan, and follow up with the patient and they expressed understanding.  Patient expressed understanding of the importance of proper follow up care.   This document serves as a record of services personally performed by Redell JUDITHANN Hans, MD, PhD. It was created on their behalf by Wanda GEANNIE Keens, COT an ophthalmic technician. The creation of this record is the provider's dictation and/or activities during the visit.    Electronically signed by:  Wanda GEANNIE Keens, COT  06/28/24 7:07 AM   Redell JUDITHANN Hans, M.D., Ph.D. Diseases & Surgery of the Retina and Vitreous [facility] 07/05/2024    Abbreviations: M myopia (nearsighted); A astigmatism; H hyperopia (farsighted); P presbyopia; Mrx spectacle prescription;  CTL contact lenses; OD right eye; OS left eye; OU both eyes  XT exotropia; ET esotropia; PEK punctate epithelial keratitis; PEE punctate epithelial erosions; DES dry eye syndrome; MGD meibomian gland dysfunction; ATs artificial tears; PFAT's preservative free artificial tears; NSC nuclear sclerotic cataract; PSC posterior subcapsular cataract; ERM epi-retinal membrane; PVD posterior vitreous detachment; RD  retinal detachment; DM diabetes mellitus; DR diabetic retinopathy; NPDR non-proliferative diabetic retinopathy; PDR proliferative diabetic retinopathy; CSME clinically significant macular edema; DME diabetic macular edema; dbh dot blot hemorrhages; CWS cotton wool spot; POAG primary open angle glaucoma; C/D cup-to-disc ratio; HVF humphrey visual field; GVF goldmann visual field; OCT optical coherence tomography; IOP intraocular pressure; BRVO Branch retinal vein occlusion; CRVO central retinal vein occlusion; CRAO central retinal artery occlusion; BRAO branch retinal artery occlusion; RT retinal tear; SB scleral buckle; PPV pars plana vitrectomy; VH Vitreous hemorrhage; PRP panretinal laser photocoagulation; IVK intravitreal kenalog ; VMT vitreomacular traction; MH Macular hole;  NVD neovascularization of the disc; NVE neovascularization elsewhere; AREDS age related eye disease study; ARMD age related macular degeneration; POAG primary open angle glaucoma; EBMD epithelial/anterior basement membrane dystrophy; ACIOL anterior chamber intraocular lens; IOL intraocular lens; PCIOL posterior chamber intraocular lens; Phaco/IOL phacoemulsification with intraocular lens placement; PRK photorefractive keratectomy; LASIK laser  assisted in situ keratomileusis; HTN hypertension; DM diabetes mellitus; COPD chronic obstructive pulmonary disease  "

## 2024-06-30 ENCOUNTER — Encounter: Payer: Self-pay | Admitting: Podiatry

## 2024-06-30 ENCOUNTER — Ambulatory Visit: Payer: MEDICAID | Admitting: Podiatry

## 2024-06-30 DIAGNOSIS — E119 Type 2 diabetes mellitus without complications: Secondary | ICD-10-CM

## 2024-06-30 DIAGNOSIS — M79675 Pain in left toe(s): Secondary | ICD-10-CM | POA: Diagnosis not present

## 2024-06-30 DIAGNOSIS — M79674 Pain in right toe(s): Secondary | ICD-10-CM | POA: Diagnosis not present

## 2024-06-30 DIAGNOSIS — Q828 Other specified congenital malformations of skin: Secondary | ICD-10-CM

## 2024-06-30 DIAGNOSIS — B351 Tinea unguium: Secondary | ICD-10-CM | POA: Diagnosis not present

## 2024-06-30 DIAGNOSIS — Z794 Long term (current) use of insulin: Secondary | ICD-10-CM

## 2024-06-30 NOTE — Progress Notes (Signed)
 Complaint:  Visit Type: Patient returns to my office for continued preventative foot care services. Complaint: Patient states my nails have grown long and thick and become painful to walk and wear shoes Patient also has callus  right  feet.  Patient has been diagnosed with DM with no foot complications. The patient presents for preventative foot care services.  Podiatric Exam: Vascular: dorsalis pedis and posterior tibial pulses are palpable bilateral. Capillary return is immediate. Temperature gradient is WNL. Skin turgor WNL  Sensorium: Normal Semmes Weinstein monofilament test. Normal tactile sensation bilaterally. Nail Exam: Pt has thick disfigured discolored nails with subungual debris noted bilateral entire nail hallux through fifth toenails Ulcer Exam: There is no evidence of ulcer or pre-ulcerative changes or infection. Orthopedic Exam: Muscle tone and strength are WNL. No limitations in general ROM. No crepitus or effusions noted. Foot type and digits show no abnormalities. Bony prominences are unremarkable. Skin: Porokeratosis medial arch right foot. And sub 2 left.   No infection or ulcers  Diagnosis:  Onychomycosis, , Pain in right toe, pain in left toes  Porokeratosis  Treatment & Plan Procedures and Treatment: Consent by patient was obtained for treatment procedures.   Debridement of mycotic and hypertrophic toenails, 1 through 5 bilateral and clearing of subungual debris. Debride callus/porokeratosis  with # 15 blade.  Done as a research officer, political party. No ulceration, no infection noted.  Return Visit-Office Procedure: Patient instructed to return to the office for a follow up visit 6 months for continued evaluation and treatment.    Cordella Bold DPM

## 2024-07-01 ENCOUNTER — Ambulatory Visit (HOSPITAL_COMMUNITY)
Admission: RE | Admit: 2024-07-01 | Discharge: 2024-07-01 | Disposition: A | Payer: MEDICAID | Source: Ambulatory Visit | Attending: Cardiovascular Disease

## 2024-07-01 DIAGNOSIS — R079 Chest pain, unspecified: Secondary | ICD-10-CM

## 2024-07-01 LAB — ECHOCARDIOGRAM COMPLETE
Area-P 1/2: 4.8 cm2
S' Lateral: 2.51 cm

## 2024-07-01 MED ORDER — DILTIAZEM HCL 25 MG/5ML IV SOLN
10.0000 mg | INTRAVENOUS | Status: DC | PRN
  Administered 2024-07-01: 10 mg via INTRAVENOUS

## 2024-07-01 MED ORDER — METOPROLOL TARTRATE 5 MG/5ML IV SOLN
10.0000 mg | Freq: Once | INTRAVENOUS | Status: AC | PRN
  Administered 2024-07-01: 10 mg via INTRAVENOUS

## 2024-07-01 MED ORDER — NITROGLYCERIN 0.4 MG SL SUBL
0.8000 mg | SUBLINGUAL_TABLET | Freq: Once | SUBLINGUAL | Status: AC
  Administered 2024-07-01: 0.8 mg via SUBLINGUAL

## 2024-07-01 MED ORDER — IOHEXOL 350 MG/ML SOLN
100.0000 mL | Freq: Once | INTRAVENOUS | Status: AC | PRN
  Administered 2024-07-01: 100 mL via INTRAVENOUS

## 2024-07-03 NOTE — Progress Notes (Signed)
 Please let him know that his coronary CTA was normal.  His echo was normal as well.  We will see him back in a few months to recheck his cholesterol.  Signed, Darryle DASEN. Barbaraann, MD, [facility]  [address] [place], [place] [address] [phone]  11:49 AM

## 2024-07-05 ENCOUNTER — Encounter (INDEPENDENT_AMBULATORY_CARE_PROVIDER_SITE_OTHER): Payer: MEDICAID | Admitting: Ophthalmology

## 2024-07-05 DIAGNOSIS — Z7985 Long-term (current) use of injectable non-insulin antidiabetic drugs: Secondary | ICD-10-CM

## 2024-07-05 DIAGNOSIS — I10 Essential (primary) hypertension: Secondary | ICD-10-CM

## 2024-07-05 DIAGNOSIS — H35033 Hypertensive retinopathy, bilateral: Secondary | ICD-10-CM

## 2024-07-05 DIAGNOSIS — E113513 Type 2 diabetes mellitus with proliferative diabetic retinopathy with macular edema, bilateral: Secondary | ICD-10-CM

## 2024-07-05 DIAGNOSIS — H40053 Ocular hypertension, bilateral: Secondary | ICD-10-CM

## 2024-07-05 DIAGNOSIS — Z794 Long term (current) use of insulin: Secondary | ICD-10-CM

## 2024-07-05 MED ORDER — ATORVASTATIN CALCIUM 20 MG PO TABS: 20.0000 mg | ORAL_TABLET | Freq: Every day | ORAL | 3 refills | Status: AC

## 2024-07-05 NOTE — Progress Notes (Signed)
 " [facility] - Clinic Note  07/14/2024   CHIEF COMPLAINT Patient presents for Retina Follow Up  HISTORY OF PRESENT ILLNESS: Albert [name] is a 39 y.o. male who presents to the clinic today for:  HPI     Retina Follow Up   Patient presents with  Diabetic Retinopathy.  In both eyes.  This started 9 weeks ago.  I, the attending physician,  performed the HPI with the patient and updated documentation appropriately.        Comments   Patient here for 9 weeks retina follow up for PDR OU. Patient states vision doing well. No eye pain. Pt denies FOL/ floaters.  BSL: not monitored regularly  A1C: 11       Last edited by Valdemar Rogue, MD on 07/14/2024  2:15 PM.     Pt states his vision is good, seeing TV well.   Referring physician: Octavia Bruckner, MD [address] [place],  [place] [address]  HISTORICAL INFORMATION:  Selected notes from the MEDICAL RECORD NUMBER Referred by Dr. Medford Octavia for retina eval LEE:  Ocular Hx- PMH-   CURRENT MEDICATIONS: No current outpatient medications on file. (Ophthalmic Drugs)   No current facility-administered medications for this visit. (Ophthalmic Drugs)   Current Outpatient Medications (Other)  Medication Sig   amLODipine (NORVASC) 10 MG tablet Take 10 mg by mouth daily.   atorvastatin  (LIPITOR) 20 MG tablet Take 1 tablet (20 mg total) by mouth daily.   cholecalciferol (VITAMIN D3) 25 MCG (1000 UT) tablet Take 1,000 Units by mouth daily.   Continuous Blood Gluc Receiver (DEXCOM G6 RECEIVER) DEVI Dexcom G6 Receiver   dapagliflozin  propanediol (FARXIGA ) 10 MG TABS tablet Take 1 tablet (10 mg total) by mouth daily.   Insulin  Lispro Prot & Lispro (HUMALOG  MIX 75/25 KWIKPEN) (75-25) 100 UNIT/ML Kwikpen Inject 65 Units into the skin 2 (two) times daily before a meal. To replace novolin mix and Glimepiride   Insulin  Pen Needle 31G X 5 MM MISC 1 Device by Does not apply route in the morning and at bedtime.    ketoconazole (NIZORAL) 2 % cream ketoconazole 2 % topical cream  APPLY TO THE AFFECTED AREA(S) BY TOPICAL ROUTE - between your toes 2x a day (Patient taking differently: As needed)   losartan (COZAAR) 50 MG tablet losartan 50 mg tablet  TAKE 1 TABLET BY MOUTH ONCE DAILY IN THE MORNING   metoprolol  tartrate (LOPRESSOR ) 100 MG tablet Take 1 tablet (100 mg total) by mouth once. Take 90-120 minutes prior to scan. Hold for SBP less than 110.   MOUNJARO 2.5 MG/0.5ML Pen Inject 2.5 mg into the skin once a week.   triamcinolone  cream (KENALOG ) 0.1 % triamcinolone  acetonide 0.1 % topical cream  APPLY 1 APPLICATION TOPICALLY TWICE DAILY to eczema on your left elbow (Patient taking differently: As needed)   No current facility-administered medications for this visit. (Other)   REVIEW OF SYSTEMS:        ALLERGIES Allergies  Allergen Reactions   Milk-Related Compounds     itching   PAST MEDICAL HISTORY Past Medical History:  Diagnosis Date   History of asthma    per pt as child   Hypertension    Phimosis    Type 2 diabetes mellitus treated with insulin  (HCC)    followed by pcp---  per pt checks sugar daily AM,  fasting average-- 190   Past Surgical History:  Procedure Laterality Date   NO PAST SURGERIES  FAMILY HISTORY Family History  Problem Relation Age of Onset   Hypertension Father    SOCIAL HISTORY Social History   Tobacco Use   Smoking status: Every Day    Current packs/day: 0.50    Average packs/day: 0.5 packs/day for 15.0 years (7.5 ttl pk-yrs)    Types: Cigarettes   Smokeless tobacco: Never  Vaping Use   Vaping status: Former   Quit date: 05/17/2018  Substance Use Topics   Alcohol use: Yes    Comment: occasional beer   Drug use: No       OPHTHALMIC EXAM:  Base Eye Exam     Visual Acuity (Snellen - Linear)       Right Left   Dist cc 20/30 -3 20/40 -1   Dist ph cc NI 20/30 -2    Correction: Glasses         Tonometry (Tonopen, 8:56 AM)        Right Left   Pressure 20 21         Pupils       Pupils Dark Light Shape React APD   Right PERRL 3 2 Round Brisk None   Left PERRL 3 2 Round Brisk None         Visual Fields       Left Right    Full Full         Extraocular Movement       Right Left    Full, Ortho Full, Ortho         Neuro/Psych     Oriented x3: Yes   Mood/Affect: Normal         Dilation     Both eyes: 1.0% Mydriacyl, 2.5% Phenylephrine @ 8:56 AM           Slit Lamp and Fundus Exam     Slit Lamp Exam       Right Left   Lids/Lashes Normal Normal   Conjunctiva/Sclera melanosis melanosis   Cornea Clear tear film debris   Anterior Chamber Deep and clear, narrow temporal angle Deep and clear, narrow temporal angle   Iris Round and Dilated, no NVI Round and Dilated   Lens trace cortical changes trace cortical changes, trace PSC, +vacuoles   Anterior Vitreous mild syneresis mild syneresis         Fundus Exam       Right Left   Disc Pink and Sharp mild pallor, sharp rim, mild PPP   C/D Ratio 0.6 0.5   Macula Flat, Good foveal reflex, scattered MA/DBH greatest temporal mac Flat, Good foveal reflex, scattered MA/DBH greatest temporally, +cystic changes--stably improved   Vessels Attenuated, Tortuous, early NV inferior midzone- regressing Attenuated, Tortuous, copper wiring, +NV inferior midzone- regressing   Periphery Attached, scattered MA/DBH, good PRP laser changes 360 Attached, scattered MA/DBH, good laser changes 360           Refraction     Wearing Rx       Sphere Cylinder Axis   Right -3.50 +0.75 073   Left -2.75 +0.75 043    Type: SVL         Manifest Refraction (Over)       Sphere Cylinder Axis Dist VA   Right -3.50 +0.75 073 20/30-3   Left               IMAGING AND PROCEDURES  Imaging and Procedures for 07/14/2024  OCT, Retina - OU - Both Eyes       Right  Eye Quality was good. Central Foveal Thickness: 218. Progression has been stable. Findings  include normal foveal contour, no IRF, no SRF, vitreomacular adhesion (Mild diffuse retinal thinning, no fluid or edema).   Left Eye Quality was good. Central Foveal Thickness: 207. Progression has been stable. Findings include normal foveal contour, no IRF, no SRF, intraretinal hyper-reflective material (Stable improvement in focal cystic changes and IRHM temporal mac; Mild diffuse retinal thinning, no fluid or edema).   Notes *Images captured and stored on drive  Diagnosis / Impression:  Mild diffuse retinal thinning OU OS: Stable improvement in focal cystic changes and IRHM temporal mac; Mild diffuse retinal thinning, no fluid or edema  Clinical management:  See below  Abbreviations: NFP - Normal foveal profile. CME - cystoid macular edema. PED - pigment epithelial detachment. IRF - intraretinal fluid. SRF - subretinal fluid. EZ - ellipsoid zone. ERM - epiretinal membrane. ORA - outer retinal atrophy. ORT - outer retinal tubulation. SRHM - subretinal hyper-reflective material. IRHM - intraretinal hyper-reflective material      Intravitreal Injection, Pharmacologic Agent - OD - Right Eye       Time Out 07/14/2024. 8:47 AM. Confirmed correct patient, procedure, site, and patient consented.   Anesthesia Topical anesthesia was used. Anesthetic medications included Lidocaine  2%, Proparacaine 0.5%.   Procedure Preparation included 5% betadine to ocular surface, eyelid speculum. A supplied (32g) needle was used.   Injection: 1.25 mg Bevacizumab  1.25mg /0.70ml   Route: Intravitreal, Site: Right Eye   NDC: H525437, Lot: [id number], Expiration date: 07/28/2024   Post-op Post injection exam found visual acuity of at least counting fingers. The patient tolerated the procedure well. There were no complications. The patient received written and verbal post procedure care education. Post injection medications were not given.      Intravitreal Injection, Pharmacologic Agent - OS - Left Eye        Time Out 07/14/2024. 8:47 AM. Confirmed correct patient, procedure, site, and patient consented.   Anesthesia Topical anesthesia was used. Anesthetic medications included Lidocaine  2%, Proparacaine 0.5%.   Procedure Preparation included 5% betadine to ocular surface, eyelid speculum. A supplied (32g) needle was used.   Injection: 1.25 mg Bevacizumab  1.25mg /0.27ml   Route: Intravitreal, Site: Left Eye   NDC: 49757-939-98, Lot: [id number]$ , Expiration date: 08/04/2024   Post-op Post injection exam found visual acuity of at least counting fingers. The patient tolerated the procedure well. There were no complications. The patient received written and verbal post procedure care education. Post injection medications were not given.           ASSESSMENT/PLAN:   ICD-10-CM   1. Proliferative diabetic retinopathy of both eyes with macular edema associated with type 2 diabetes mellitus (HCC)  E11.3513 OCT, Retina - OU - Both Eyes    Intravitreal Injection, Pharmacologic Agent - OD - Right Eye    Intravitreal Injection, Pharmacologic Agent - OS - Left Eye    Bevacizumab  (AVASTIN ) SOLN 1.25 mg    Bevacizumab  (AVASTIN ) SOLN 1.25 mg    2. Current use of insulin  (HCC)  Z79.4     3. Diabetes mellitus treated with injections of non-insulin  medication (HCC)  E11.9    Z79.85     4. Essential hypertension  I10     5. 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 Proliferative diabetic retinopathy w/o DME, OU  - A1C 20 December 2023 per pt report - s/p IVA  OU #1 (08.6.25), #2 (09.03.25),  #3 (10.01.25), #4 (10.29.25), #5 (10.02.25), #6 (12.02.25) - s/p PRP OD (09.16.25) - s/p PRP OS  (10.13.25) - FA (08.06.25) shows PDR OU, Scattered patches of vascular non profusion greatest inferior quadrant OU; Focal leaking NV inferior midzone OU--repeat FA ~April 2026 - OCT shows Mild diffuse retinal thinning OU; OS: Stable improvement in focal  cystic changes and IRHM temporal mac; Mild diffuse retinal thinning, no fluid or edema at 9+ weeks. - BCVA OD 20/30 from 20/25 OS 20/30- stable - recommend IVA OU #7 today (02.05.26) with f/u in 8 weeks - pt wishes to proceed  - RBA of procedure discussed, questions answered - informed consent obtained and signed - see procedure note - Avastin  informed consent obtained and signed 08.06.25 - f/u 8 weeks for DFE, OCT, repeat FA, possible injections  4,5. Hypertensive retinopathy OU - discussed importance of tight BP control - monitor   6. Ocular HTN OU  - IOP 20,21 - Continue Brimonidine BID OU, Cosopt  BID OU   Ophthalmic Meds Ordered this visit:  Meds ordered this encounter  Medications   Bevacizumab  (AVASTIN ) SOLN 1.25 mg   Bevacizumab  (AVASTIN ) SOLN 1.25 mg     Return in about 8 weeks (around 09/08/2024) for PDR OU, DFE, OCT, likely IVA OU.  There are no Patient Instructions on file for this visit.  Explained the diagnoses, plan, and follow up with the patient and they expressed understanding.  Patient expressed understanding of the importance of proper follow up care.   This document serves as a record of services personally performed by Redell JUDITHANN Hans, MD, PhD. It was created on their behalf by Avelina Pereyra, COA an ophthalmic technician. The creation of this record is the provider's dictation and/or activities during the visit.   Electronically signed by: Avelina GORMAN Pereyra, COT  07/14/24  2:15 PM  This document serves as a record of services personally performed by Redell JUDITHANN Hans, MD, PhD. It was created on their behalf by Almetta Pesa, an ophthalmic technician. The creation of this record is the provider's dictation and/or activities during the visit.    Electronically signed by: Almetta Pesa, OA, 07/14/24  2:15 PM   Redell JUDITHANN Hans, M.D., Ph.D. Diseases & Surgery of the Retina and Vitreous [facility] 07/14/2024  I have reviewed the above  documentation for accuracy and completeness, and I agree with the above. Redell JUDITHANN Hans, M.D., Ph.D. 07/14/24 2:21 PM   Abbreviations: M myopia (nearsighted); A astigmatism; H hyperopia (farsighted); P presbyopia; Mrx spectacle prescription;  CTL contact lenses; OD right eye; OS left eye; OU both eyes  XT exotropia; ET esotropia; PEK punctate epithelial keratitis; PEE punctate epithelial erosions; DES dry eye syndrome; MGD meibomian gland dysfunction; ATs artificial tears; PFAT's preservative free artificial tears; NSC nuclear sclerotic cataract; PSC posterior subcapsular cataract; ERM epi-retinal membrane; PVD posterior vitreous detachment; RD retinal detachment; DM diabetes mellitus; DR diabetic retinopathy; NPDR non-proliferative diabetic retinopathy; PDR proliferative diabetic retinopathy; CSME clinically significant macular edema; DME diabetic macular edema; dbh dot blot hemorrhages; CWS cotton wool spot; POAG primary open angle glaucoma; C/D cup-to-disc ratio; HVF humphrey visual field; GVF goldmann visual field; OCT optical coherence tomography; IOP intraocular pressure; BRVO Branch retinal vein occlusion; CRVO central retinal vein occlusion; CRAO central retinal artery occlusion; BRAO branch retinal artery occlusion; RT retinal tear; SB scleral buckle; PPV pars plana vitrectomy; VH Vitreous hemorrhage; PRP panretinal laser photocoagulation; IVK intravitreal kenalog ; VMT vitreomacular traction; MH Macular hole;  NVD neovascularization of the disc; NVE neovascularization elsewhere; AREDS age related eye disease study; ARMD age related macular degeneration; POAG primary open angle glaucoma; EBMD epithelial/anterior basement membrane dystrophy; ACIOL anterior chamber intraocular lens; IOL intraocular lens; PCIOL posterior chamber intraocular lens; Phaco/IOL phacoemulsification with intraocular lens placement; PRK photorefractive keratectomy; LASIK laser assisted in situ keratomileusis; HTN hypertension; DM  diabetes mellitus; COPD chronic obstructive pulmonary disease  "

## 2024-07-14 ENCOUNTER — Ambulatory Visit (INDEPENDENT_AMBULATORY_CARE_PROVIDER_SITE_OTHER): Payer: MEDICAID | Admitting: Ophthalmology

## 2024-07-14 ENCOUNTER — Encounter (INDEPENDENT_AMBULATORY_CARE_PROVIDER_SITE_OTHER): Payer: Self-pay | Admitting: Ophthalmology

## 2024-07-14 DIAGNOSIS — H40053 Ocular hypertension, bilateral: Secondary | ICD-10-CM | POA: Diagnosis not present

## 2024-07-14 DIAGNOSIS — H35033 Hypertensive retinopathy, bilateral: Secondary | ICD-10-CM | POA: Diagnosis not present

## 2024-07-14 DIAGNOSIS — E119 Type 2 diabetes mellitus without complications: Secondary | ICD-10-CM

## 2024-07-14 DIAGNOSIS — I10 Essential (primary) hypertension: Secondary | ICD-10-CM

## 2024-07-14 DIAGNOSIS — E113513 Type 2 diabetes mellitus with proliferative diabetic retinopathy with macular edema, bilateral: Secondary | ICD-10-CM

## 2024-07-14 DIAGNOSIS — Z7985 Long-term (current) use of injectable non-insulin antidiabetic drugs: Secondary | ICD-10-CM

## 2024-07-14 DIAGNOSIS — Z794 Long term (current) use of insulin: Secondary | ICD-10-CM

## 2024-07-14 MED ORDER — BEVACIZUMAB CHEMO INJECTION 1.25MG/0.05ML SYRINGE FOR KALEIDOSCOPE
1.2500 mg | INTRAVITREAL | Status: AC | PRN
  Administered 2024-07-14: 1.25 mg via INTRAVITREAL

## 2024-09-01 ENCOUNTER — Ambulatory Visit: Payer: MEDICAID | Admitting: Emergency Medicine

## 2024-09-08 ENCOUNTER — Encounter (INDEPENDENT_AMBULATORY_CARE_PROVIDER_SITE_OTHER): Payer: MEDICAID | Admitting: Ophthalmology

## 2024-12-28 ENCOUNTER — Ambulatory Visit: Payer: MEDICAID | Admitting: Podiatry
